# Patient Record
Sex: Female | Born: 1960 | Race: White | Hispanic: No | Marital: Married | Smoking: Former smoker
Health system: Southern US, Community
[De-identification: ages and names within clinical notes are randomized; demographics above are authoritative.]

## PROBLEM LIST (undated history)

## (undated) DIAGNOSIS — F419 Anxiety disorder, unspecified: Secondary | ICD-10-CM

## (undated) DIAGNOSIS — E039 Hypothyroidism, unspecified: Secondary | ICD-10-CM

## (undated) DIAGNOSIS — J45909 Unspecified asthma, uncomplicated: Secondary | ICD-10-CM

## (undated) DIAGNOSIS — I10 Essential (primary) hypertension: Secondary | ICD-10-CM

## (undated) HISTORY — DX: Essential (primary) hypertension: I10

## (undated) HISTORY — DX: Unspecified asthma, uncomplicated: J45.909

## (undated) HISTORY — DX: Anxiety disorder, unspecified: F41.9

## (undated) HISTORY — DX: Hypothyroidism, unspecified: E03.9

---

## 1997-11-04 ENCOUNTER — Other Ambulatory Visit: Admission: RE | Admit: 1997-11-04 | Discharge: 1997-11-04 | Payer: Self-pay | Admitting: Gynecology

## 1999-09-20 ENCOUNTER — Other Ambulatory Visit: Admission: RE | Admit: 1999-09-20 | Discharge: 1999-09-20 | Payer: Self-pay | Admitting: Gynecology

## 2000-03-30 ENCOUNTER — Other Ambulatory Visit: Admission: RE | Admit: 2000-03-30 | Discharge: 2000-03-30 | Payer: Self-pay | Admitting: Gynecology

## 2001-05-14 ENCOUNTER — Other Ambulatory Visit: Admission: RE | Admit: 2001-05-14 | Discharge: 2001-05-14 | Payer: Self-pay | Admitting: Gynecology

## 2002-07-10 ENCOUNTER — Other Ambulatory Visit: Admission: RE | Admit: 2002-07-10 | Discharge: 2002-07-10 | Payer: Self-pay | Admitting: Gynecology

## 2006-10-31 ENCOUNTER — Other Ambulatory Visit: Admission: RE | Admit: 2006-10-31 | Discharge: 2006-10-31 | Payer: Self-pay | Admitting: Gynecology

## 2009-08-04 ENCOUNTER — Ambulatory Visit: Payer: Self-pay | Admitting: Gynecology

## 2009-08-04 ENCOUNTER — Other Ambulatory Visit: Admission: RE | Admit: 2009-08-04 | Discharge: 2009-08-04 | Payer: Self-pay | Admitting: Gynecology

## 2012-11-29 DIAGNOSIS — E039 Hypothyroidism, unspecified: Secondary | ICD-10-CM

## 2012-11-29 HISTORY — DX: Hypothyroidism, unspecified: E03.9

## 2012-12-23 ENCOUNTER — Encounter: Payer: Self-pay | Admitting: Gynecology

## 2012-12-23 ENCOUNTER — Ambulatory Visit (INDEPENDENT_AMBULATORY_CARE_PROVIDER_SITE_OTHER): Payer: 59 | Admitting: Gynecology

## 2012-12-23 ENCOUNTER — Other Ambulatory Visit (HOSPITAL_COMMUNITY)
Admission: RE | Admit: 2012-12-23 | Discharge: 2012-12-23 | Disposition: A | Discharge status: Home / Self Care | Payer: 59 | Source: Ambulatory Visit | Attending: Gynecology | Admitting: Gynecology

## 2012-12-23 VITALS — BP 124/74 | Ht 66.0 in | Wt 175.0 lb

## 2012-12-23 DIAGNOSIS — Z30431 Encounter for routine checking of intrauterine contraceptive device: Secondary | ICD-10-CM

## 2012-12-23 DIAGNOSIS — Z13220 Encounter for screening for lipoid disorders: Secondary | ICD-10-CM

## 2012-12-23 DIAGNOSIS — N951 Menopausal and female climacteric states: Secondary | ICD-10-CM

## 2012-12-23 DIAGNOSIS — Z1151 Encounter for screening for human papillomavirus (HPV): Secondary | ICD-10-CM | POA: Insufficient documentation

## 2012-12-23 DIAGNOSIS — Z01419 Encounter for gynecological examination (general) (routine) without abnormal findings: Secondary | ICD-10-CM

## 2012-12-23 NOTE — Patient Instructions (Addendum)
Call to Schedule your mammogram  Facilities in [place]: 1)  [facility], [address] Phone: [phone] 2)  [facility]. [facility], [address] Phone: [phone] 3)  Dr. Bertrand at [facility]  [address] Phone: [phone]     Mammogram A mammogram is an X-ray test to find changes in a woman's breast. You should get a mammogram if:  You are 40 years of age or older  You have risk factors.   Your doctor recommends that you have one.  BEFORE THE TEST  Do not schedule the test the week before your period, especially if your breasts are sore during this time.  On the day of your mammogram:  Wash your breasts and armpits well. After washing, do not put on any deodorant or talcum powder on until after your test.   Eat and drink as you usually do.   Take your medicines as usual.   If you are diabetic and take insulin, make sure you:   Eat before coming for your test.   Take your insulin as usual.   If you cannot keep your appointment, call before the appointment to cancel. Schedule another appointment.  TEST  You will need to undress from the waist up. You will put on a hospital gown.   Your breast will be put on the mammogram machine, and it will press firmly on your breast with a piece of plastic called a compression paddle. This will make your breast flatter so that the machine can X-ray all parts of your breast.   Both breasts will be X-rayed. Each breast will be X-rayed from above and from the side. An X-ray might need to be taken again if the picture is not good enough.   The mammogram will last about 15 to 30 minutes.  AFTER THE TEST Finding out the results of your test Ask when your test results will be ready. Make sure you get your test results.  Document Released: 07/14/2008 Document Revised: 04/06/2011 Document Reviewed: 07/14/2008 ExitCare Patient  Information 2012 [employer].   

## 2012-12-23 NOTE — Progress Notes (Signed)
Stephanie Duncan 1960-09-10 [id number]        52 y.o.  [id number] for annual exam.  Has not been in for over 3 years. Several issues noted below.  Past medical history,surgical history, medications, allergies, family history and social history were all reviewed and documented in the EPIC chart.  ROS:  Performed and pertinent positives and negatives are included in the history, assessment and plan .  Exam: Kim assistant Filed Vitals:   12/23/12 1524  BP: 124/74  Height: 5\' 6"  (1.676 m)  Weight: 175 lb (79.379 kg)   General appearance  Normal Skin grossly normal Head/Neck normal with no cervical or supraclavicular adenopathy thyroid normal Lungs  clear Cardiac RR, without RMG Abdominal  soft, nontender, without masses, organomegaly or hernia Breasts  examined lying and sitting without masses, retractions, discharge or axillary adenopathy. Pelvic  Ext/BUS/vagina  normal  Cervix  normal IUD string visualized. Pap HPV  Uterus  anteverted, normal size, shape and contour, midline and mobile nontender   Adnexa  Without masses or tenderness    Anus and perineum  normal   Rectovaginal  normal sphincter tone without palpated masses or tenderness.    Assessment/Plan:  52 y.o. [id number] female for annual exam, amenorrheic, Mirena IUD.Marland Kitchen   1. Mirena IUD. In place for 6 years. Recommended removal now. Patient agrees. IUD string was visualized, grasped with the [facility] forcep and removed. It was shown to the patient and discarded. Patient having menopausal symptoms. Will check FSH. If elevated then plan barrier contraception for now and menstrual calendar. If normal then we'll consider replacement of her Mirena IUD. 2. Menopausal symptoms. Check TSH/FSH. Has tried over-the-counter products without success.  I reviewed the whole issue of HRT with her to include the WHI study with increased risk of stroke, heart attack, DVT and breast cancer. The ACOG and NAMS statements for lowest dose for the shortest period of  time reviewed. Transdermal versus oral first-pass effect benefit discussed.  Patient wants to monitor at present. She will call if symptoms worsen wants to consider HRT. 3. Pap smear/HPV done today. No history of abnormal Pap smears previously. 4. Mammography 2012. Patient knows she is overdue and agrees to schedule. SBE monthly reviewed. 5. Colonoscopy never. Recommended she schedule and she agrees to do so by the end of the year. 6. Health maintenance. Baseline CBC comprehensive metabolic panel lipid profile urinalysis FSH TSH and vitamin D ordered.  Note: This document was prepared with digital dictation and possible smart phrase technology. Any transcriptional errors that result from this process are unintentional.   Dara Lords MD, 3:48 PM 12/23/2012

## 2012-12-24 ENCOUNTER — Other Ambulatory Visit: Payer: Self-pay | Admitting: Gynecology

## 2012-12-24 DIAGNOSIS — R7989 Other specified abnormal findings of blood chemistry: Secondary | ICD-10-CM

## 2012-12-26 ENCOUNTER — Encounter: Payer: Self-pay | Admitting: Gynecology

## 2012-12-26 ENCOUNTER — Other Ambulatory Visit: Payer: 59

## 2012-12-26 DIAGNOSIS — R7989 Other specified abnormal findings of blood chemistry: Secondary | ICD-10-CM

## 2012-12-26 LAB — COMPREHENSIVE METABOLIC PANEL
AST: 19 U/L (ref 0–37)
Albumin: 4.3 g/dL (ref 3.5–5.2)
Alkaline Phosphatase: 101 U/L (ref 39–117)
Glucose, Bld: 87 mg/dL (ref 70–99)
Potassium: 5.3 mEq/L (ref 3.5–5.3)
Sodium: 140 mEq/L (ref 135–145)
Total Protein: 6.8 g/dL (ref 6.0–8.3)

## 2012-12-26 LAB — TSH: TSH: 7.445 u[IU]/mL — ABNORMAL HIGH (ref 0.350–4.500)

## 2012-12-31 ENCOUNTER — Telehealth: Payer: Self-pay | Admitting: *Deleted

## 2012-12-31 NOTE — Telephone Encounter (Signed)
Message copied by Aura Camps on Tue Dec 31, 2012 10:09 AM ------      Message from: Keenan Bachelor      Created: Thu Dec 26, 2012  3:57 PM      Regarding: Referral Endocrinologist       Patient informed. She knows it will be next week before she hears about appt date/time.            Tell patient TSH still elevated consistent with hypothyroidism or underactive thyroid. Recommend appointment with endocrinologist for evaluation and treatment initiation.  ------

## 2012-12-31 NOTE — Telephone Encounter (Signed)
Notes faxed to [facility] office they will contact with time and date.

## 2013-01-03 ENCOUNTER — Telehealth: Payer: Self-pay

## 2013-01-03 NOTE — Telephone Encounter (Signed)
Patient said she saw you last week and was informed her TSH was elevated at 7.455. We referred her to Dr. Talmage Nap.  They sent her paperwork and appt info and her appt is 02/07/13.  She called them and this is the soonest they can see her.  She said she does not think appropriate she should wait until October to be seen.  She wants you to refer her to someone else.

## 2013-01-03 NOTE — Telephone Encounter (Signed)
Victorino Dike, I noticed on the lab result that Dr. Velvet Bathe just said "endocrinology referral".  He did not specify Dr. Talmage Nap. Wondering if you could get her in with anyone else sooner? (I do not know any other endoc MD names.) Thanks for handling this!

## 2013-01-06 NOTE — Telephone Encounter (Signed)
appt with Dr. Romero Belling on 01/15/13 @ 1:30 pm left message for pt to call regarding this.

## 2013-01-06 NOTE — Telephone Encounter (Signed)
They sent her paperwork and appt info and her appt is 02/07/13. She called them and this is the soonest they can see her.  appt scheduled with Dr.Ellison on 01/15/13 @ 1:30 pm

## 2013-01-06 NOTE — Telephone Encounter (Signed)
Pt informed with the below note. 

## 2013-01-15 ENCOUNTER — Ambulatory Visit (INDEPENDENT_AMBULATORY_CARE_PROVIDER_SITE_OTHER): Payer: 59 | Admitting: Endocrinology

## 2013-01-15 ENCOUNTER — Encounter: Payer: Self-pay | Admitting: Endocrinology

## 2013-01-15 VITALS — BP 130/80 | HR 80 | Ht 67.0 in | Wt 173.0 lb

## 2013-01-15 DIAGNOSIS — IMO0001 Reserved for inherently not codable concepts without codable children: Secondary | ICD-10-CM

## 2013-01-15 DIAGNOSIS — Z309 Encounter for contraceptive management, unspecified: Secondary | ICD-10-CM

## 2013-01-15 DIAGNOSIS — F329 Major depressive disorder, single episode, unspecified: Secondary | ICD-10-CM

## 2013-01-15 DIAGNOSIS — J45909 Unspecified asthma, uncomplicated: Secondary | ICD-10-CM

## 2013-01-15 DIAGNOSIS — E039 Hypothyroidism, unspecified: Secondary | ICD-10-CM

## 2013-01-15 MED ORDER — LEVOTHYROXINE SODIUM 50 MCG PO TABS: 50.0000 ug | ORAL_TABLET | Freq: Every day | ORAL | Status: DC

## 2013-01-15 NOTE — Progress Notes (Signed)
Subjective:    Patient ID: Stephanie Duncan, female    DOB: 02-Dec-1960, 52 y.o.   MRN: [id number]  HPI Pt was noted approx 3 weeks ago to have abnormal TSH.  She has slight hair loss on the head, and assoc fatigue.   She was in ICU ([facility]) dec 2013, for severe asthma.  She has not recently been on steroids, except for MDI's.   Past Medical History  Diagnosis Date  . Asthma   . Hypothyroid 11/2012    No past surgical history on file.  History   Social History  . Marital Status: Married    Spouse Name: N/A    Number of Children: N/A  . Years of Education: N/A   Occupational History  . Not on file.   Social History Main Topics  . Smoking status: Former Games developer  . Smokeless tobacco: Not on file  . Alcohol Use: Yes     Comment: Rare  . Drug Use: No  . Sexual Activity: Yes     Comment: Mirena inserted 11-26-06   Other Topics Concern  . Not on file   Social History Narrative  . No narrative on file    Current Outpatient Prescriptions on File Prior to Visit  Medication Sig Dispense Refill  . citalopram (CELEXA) 20 MG tablet Take 20 mg by mouth daily.      . Fluticasone Propionate, Inhal, (FLOVENT DISKUS) 250 MCG/BLIST AEPB Inhale into the lungs.      Marland Kitchen levonorgestrel (MIRENA) 20 MCG/24HR IUD 1 each by Intrauterine route once.      . montelukast (SINGULAIR) 10 MG tablet Take 10 mg by mouth at bedtime.      . triamcinolone (NASACORT) 55 MCG/ACT nasal inhaler Place 2 sprays into the nose daily.       No current facility-administered medications on file prior to visit.   No Known Allergies  Family History  Problem Relation Age of Onset  . Hypertension Mother   . Stroke Mother   no thyroid probs in immediate family. BP 130/80  Pulse 80  Ht 5\' 7"  (1.702 m)  Wt 173 lb (78.472 kg)  BMI 27.09 kg/m2  SpO2 95%  Review of Systems denies depression, cramps, sob, fever, memory loss, constipation, numbness, blurry vision, myalgias, rhinorrhea, and syncope. She has weight gain,  easy bruising, and dry skin.      Objective:   Physical Exam VS: see vs page GEN: no distress HEAD: head: no deformity eyes: no periorbital swelling, no proptosis external nose and ears are normal mouth: no lesion seen NECK: thyroid is slightly and diffusely enlarged.   CHEST WALL: no deformity LUNGS:  Clear to auscultation CV: reg rate and rhythm, no murmur ABD: abdomen is soft, nontender.  no hepatosplenomegaly.  not distended.  no hernia MUSCULOSKELETAL: muscle bulk and strength are grossly normal.  no obvious joint swelling.  gait is normal and steady EXTEMITIES: no deformity.  no ulcer on the feet.  feet are of normal color and temp.  no edema PULSES: dorsalis pedis intact bilat.  no carotid bruit NEURO:  cn 2-12 grossly intact.   readily moves all 4's.  sensation is intact to touch on the feet SKIN:  Normal texture and temperature.  No rash or suspicious lesion is visible.   NODES:  None palpable at the neck PSYCH: alert, oriented x3.  Does not appear anxious nor depressed.  Lab Results  Component Value Date   TSH 7.445* 12/26/2012   T4TOTAL 5.5 12/26/2012  Assessment & Plan:  Hypothyroidism, new, mild Hair loss. Unlikely to be thyroid-related Asthma: she has not recently been on enough steroid to account for the other sxs noted above, such as fatigue.

## 2013-01-15 NOTE — Patient Instructions (Addendum)
i have sent a prescription to your pharmacy, for the thyroid. Please see dr Derrell Lolling in 4-6 weeks.  You will be due for a repeat blood test then. I would be happy to see you back here whenever you want.     Hypothyroidism The thyroid is a large gland located in the lower front of your neck. The thyroid gland helps control metabolism. Metabolism is how your body handles food. It controls metabolism with the hormone thyroxine. When this gland is underactive (hypothyroid), it produces too little hormone.  CAUSES These include:   Absence or destruction of thyroid tissue.  Goiter due to iodine deficiency.  Goiter due to medications.  Congenital defects (since birth).  Problems with the pituitary. This causes a lack of TSH (thyroid stimulating hormone). This hormone tells the thyroid to turn out more hormone. SYMPTOMS  Lethargy (feeling as though you have no energy)  Cold intolerance  Weight gain (in spite of normal food intake)  Dry skin  Coarse hair  Menstrual irregularity (if severe, may lead to infertility)  Slowing of thought processes Cardiac problems are also caused by insufficient amounts of thyroid hormone. Hypothyroidism in the newborn is cretinism, and is an extreme form. It is important that this form be treated adequately and immediately or it will lead rapidly to retarded physical and mental development. DIAGNOSIS  To prove hypothyroidism, your caregiver may do blood tests and ultrasound tests. Sometimes the signs are hidden. It may be necessary for your caregiver to watch this illness with blood tests either before or after diagnosis and treatment. TREATMENT  Low levels of thyroid hormone are increased by using synthetic thyroid hormone. This is a safe, effective treatment. It usually takes about four weeks to gain the full effects of the medication. After you have the full effect of the medication, it will generally take another four weeks for problems to leave. Your  caregiver may start you on low doses. If you have had heart problems the dose may be gradually increased. It is generally not an emergency to get rapidly to normal. HOME CARE INSTRUCTIONS   Take your medications as your caregiver suggests. Let your caregiver know of any medications you are taking or start taking. Your caregiver will help you with dosage schedules.  As your condition improves, your dosage needs may increase. It will be necessary to have continuing blood tests as suggested by your caregiver.  Report all suspected medication side effects to your caregiver. SEEK MEDICAL CARE IF: Seek medical care if you develop:  Sweating.  Tremulousness (tremors).  Anxiety.  Rapid weight loss.  Heat intolerance.  Emotional swings.  Diarrhea.  Weakness. SEEK IMMEDIATE MEDICAL CARE IF:  You develop chest pain, an irregular heart beat (palpitations), or a rapid heart beat. MAKE SURE YOU:   Understand these instructions.  Will watch your condition.  Will get help right away if you are not doing well or get worse. Document Released: 04/17/2005 Document Revised: 07/10/2011 Document Reviewed: 12/06/2007 [facility] Patient Information 2014 [place], [place].

## 2013-01-16 DIAGNOSIS — F329 Major depressive disorder, single episode, unspecified: Secondary | ICD-10-CM | POA: Insufficient documentation

## 2013-01-16 DIAGNOSIS — J45909 Unspecified asthma, uncomplicated: Secondary | ICD-10-CM | POA: Insufficient documentation

## 2013-01-16 DIAGNOSIS — E039 Hypothyroidism, unspecified: Secondary | ICD-10-CM | POA: Insufficient documentation

## 2013-01-16 DIAGNOSIS — F3289 Other specified depressive episodes: Secondary | ICD-10-CM | POA: Insufficient documentation

## 2013-01-23 ENCOUNTER — Ambulatory Visit: Payer: 59 | Admitting: Endocrinology

## 2013-01-27 ENCOUNTER — Ambulatory Visit: Payer: 59 | Admitting: Endocrinology

## 2013-02-18 ENCOUNTER — Telehealth: Payer: Self-pay | Admitting: Endocrinology

## 2013-02-18 NOTE — Telephone Encounter (Signed)
please call patient: Ultrasound shows an inflamed thyroid.  This is expected when it is underactive. Please continue the same medication. I would be happy to see you back here whenever you want.

## 2013-02-19 NOTE — Telephone Encounter (Signed)
Left message, pt to call if any questions.

## 2013-03-06 ENCOUNTER — Other Ambulatory Visit: Payer: Self-pay

## 2013-04-18 ENCOUNTER — Other Ambulatory Visit (HOSPITAL_COMMUNITY): Payer: Self-pay | Admitting: Orthopedic Surgery

## 2013-04-18 DIAGNOSIS — M25879 Other specified joint disorders, unspecified ankle and foot: Secondary | ICD-10-CM

## 2013-07-13 ENCOUNTER — Other Ambulatory Visit: Payer: Self-pay | Admitting: Endocrinology

## 2013-08-11 ENCOUNTER — Other Ambulatory Visit: Payer: Self-pay | Admitting: Endocrinology

## 2013-11-20 ENCOUNTER — Other Ambulatory Visit: Payer: Self-pay | Admitting: Gynecology

## 2013-11-20 DIAGNOSIS — Z1231 Encounter for screening mammogram for malignant neoplasm of breast: Secondary | ICD-10-CM

## 2013-12-25 ENCOUNTER — Ambulatory Visit (INDEPENDENT_AMBULATORY_CARE_PROVIDER_SITE_OTHER): Payer: 59 | Admitting: Gynecology

## 2013-12-25 ENCOUNTER — Ambulatory Visit (HOSPITAL_COMMUNITY)
Admission: RE | Admit: 2013-12-25 | Discharge: 2013-12-25 | Disposition: A | Discharge status: Home / Self Care | Payer: 59 | Source: Ambulatory Visit | Attending: Gynecology | Admitting: Gynecology

## 2013-12-25 ENCOUNTER — Encounter: Payer: Self-pay | Admitting: Gynecology

## 2013-12-25 VITALS — BP 122/78 | Ht 66.0 in | Wt 184.0 lb

## 2013-12-25 DIAGNOSIS — E038 Other specified hypothyroidism: Secondary | ICD-10-CM

## 2013-12-25 DIAGNOSIS — Z1231 Encounter for screening mammogram for malignant neoplasm of breast: Secondary | ICD-10-CM

## 2013-12-25 DIAGNOSIS — Z01419 Encounter for gynecological examination (general) (routine) without abnormal findings: Secondary | ICD-10-CM

## 2013-12-25 IMAGING — MG MM DIGITAL SCREENING BILAT
4 series · 4 of 4 positions shown · non-contrast
Comparison: Previous exam(s).

CLINICAL DATA: Screening.

EXAM:
DIGITAL SCREENING BILATERAL MAMMOGRAM WITH CAD

[R CC]
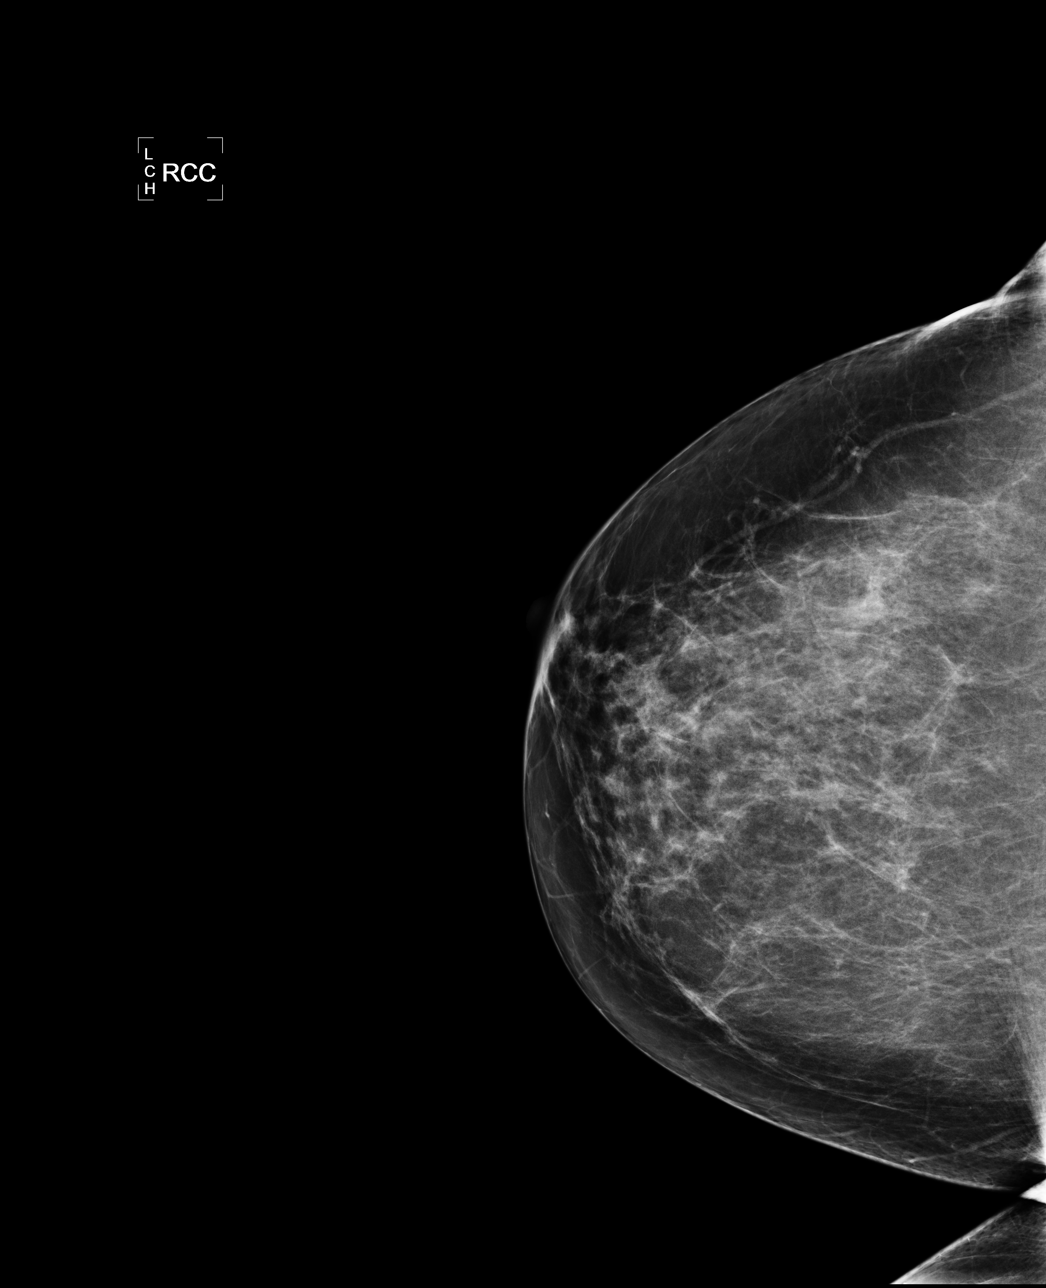

[R MLO]
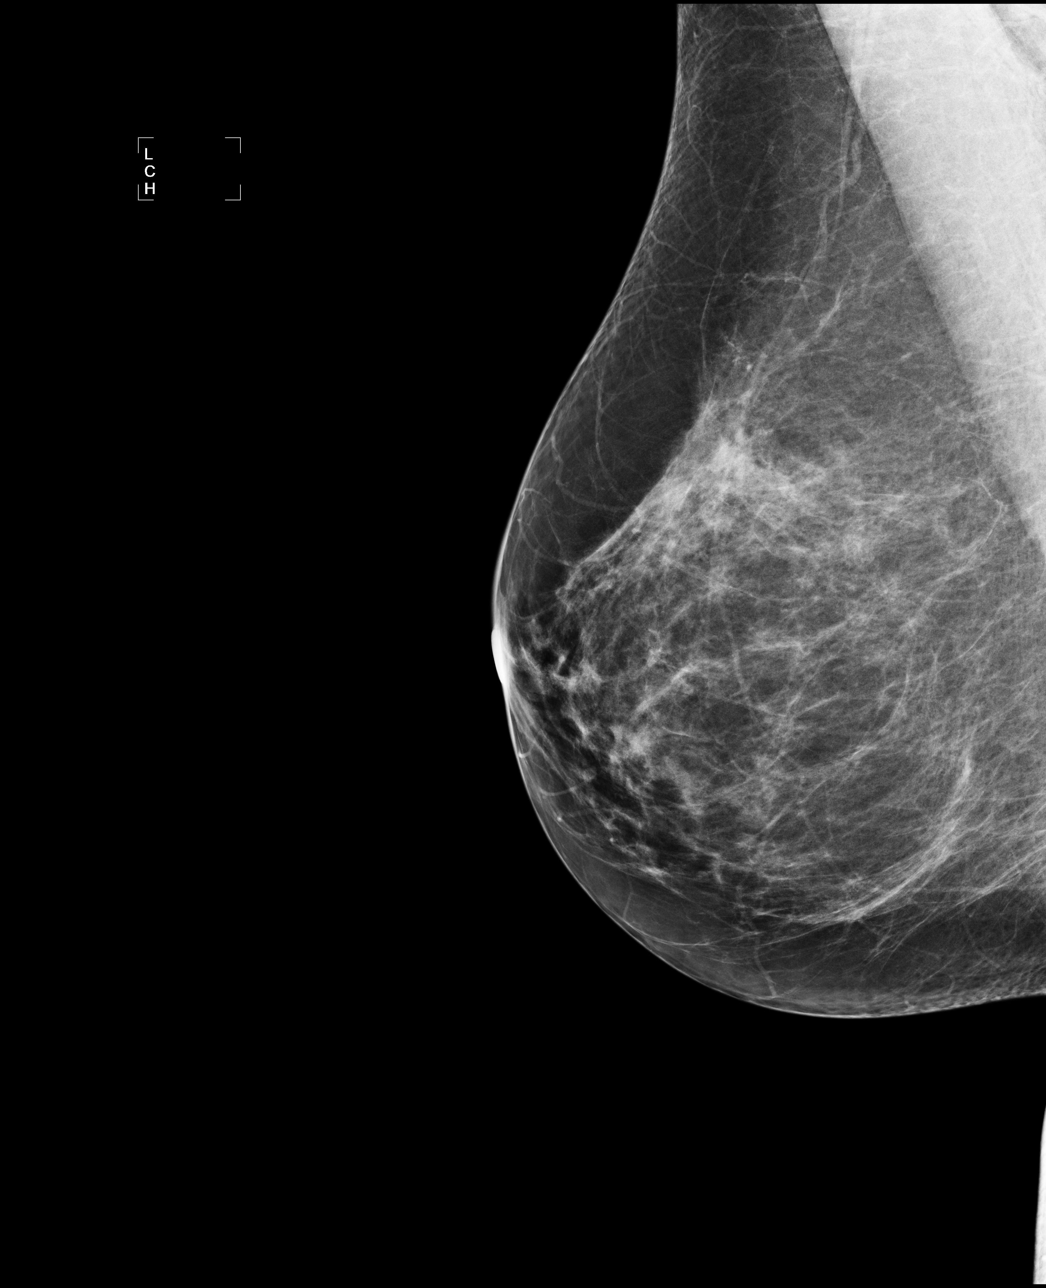

[L CC]
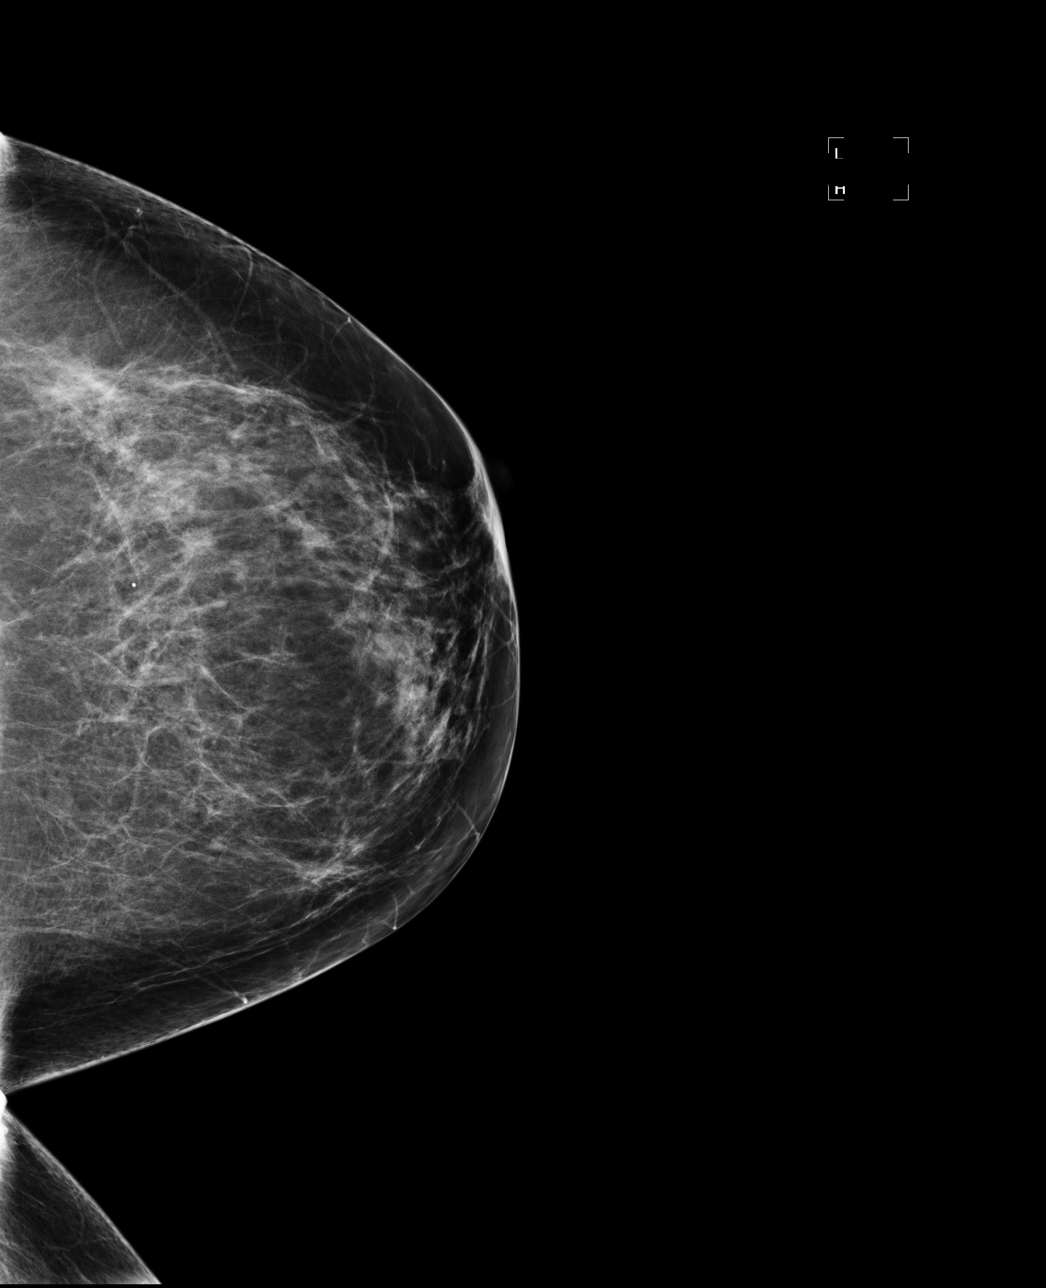

[L MLO]
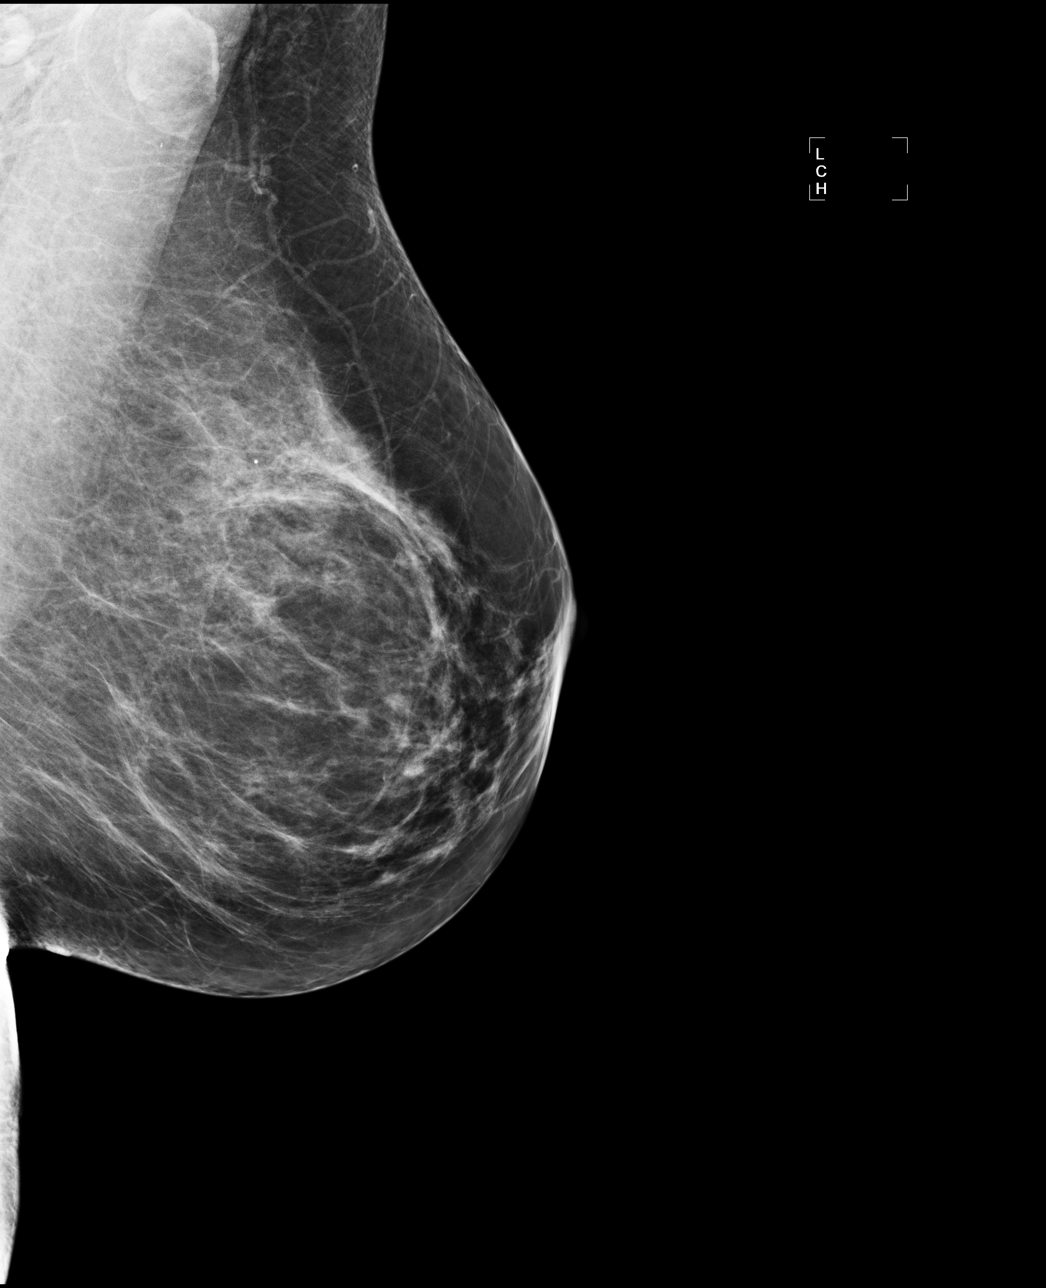

[4 of 4 positions shown; findings below may reference images not displayed]

ACR Breast Density Category b: There are scattered areas of
fibroglandular density.
FINDINGS: There are no findings suspicious for malignancy. Images were
processed with CAD.
IMPRESSION: No mammographic evidence of malignancy. A result letter of this
screening mammogram will be mailed directly to the patient.

RECOMMENDATION:
Screening mammogram in one year. (Code:[US])

BI-RADS CATEGORY  1: Negative.

## 2013-12-25 NOTE — Patient Instructions (Signed)
Schedule colonoscopy with [facility] at [phone] or [facility]enterology at [phone]  You may obtain a copy of any labs that were done today by logging onto MyChart as outlined in the instructions provided with your AVS (after visit summary). The office will not call with normal lab results but certainly if there are any significant abnormalities then we will contact you.   Health Maintenance, Female A healthy lifestyle and preventative care can promote health and wellness.  Maintain regular health, dental, and eye exams.  Eat a healthy diet. Foods like vegetables, fruits, whole grains, low-fat dairy products, and lean protein foods contain the nutrients you need without too many calories. Decrease your intake of foods high in solid fats, added sugars, and salt. Get information about a proper diet from your caregiver, if necessary.  Regular physical exercise is one of the most important things you can do for your health. Most adults should get at least 150 minutes of moderate-intensity exercise (any activity that increases your heart rate and causes you to sweat) each week. In addition, most adults need muscle-strengthening exercises on 2 or more days a week.   Maintain a healthy weight. The body mass index (BMI) is a screening tool to identify possible weight problems. It provides an estimate of body fat based on height and weight. Your caregiver can help determine your BMI, and can help you achieve or maintain a healthy weight. For adults 20 years and older:  A BMI below 18.5 is considered underweight.  A BMI of 18.5 to 24.9 is normal.  A BMI of 25 to 29.9 is considered overweight.  A BMI of 30 and above is considered obese.  Maintain normal blood lipids and cholesterol by exercising and minimizing your intake of saturated fat. Eat a balanced diet with plenty of fruits and vegetables. Blood tests for lipids and cholesterol should begin at age 64 and be repeated every  5 years. If your lipid or cholesterol levels are high, you are over 50, or you are a high risk for heart disease, you may need your cholesterol levels checked more frequently.Ongoing high lipid and cholesterol levels should be treated with medicines if diet and exercise are not effective.  If you smoke, find out from your caregiver how to quit. If you do not use tobacco, do not start.  Lung cancer screening is recommended for adults aged 58 80 years who are at high risk for developing lung cancer because of a history of smoking. Yearly low-dose computed tomography (CT) is recommended for people who have at least a 30-pack-year history of smoking and are a current smoker or have quit within the past 15 years. A pack year of smoking is smoking an average of 1 pack of cigarettes a day for 1 year (for example: 1 pack a day for 30 years or 2 packs a day for 15 years). Yearly screening should continue until the smoker has stopped smoking for at least 15 years. Yearly screening should also be stopped for people who develop a health problem that would prevent them from having lung cancer treatment.  If you are pregnant, do not drink alcohol. If you are breastfeeding, be very cautious about drinking alcohol. If you are not pregnant and choose to drink alcohol, do not exceed 1 drink per day. One drink is considered to be 12 ounces (355 mL) of beer, 5 ounces (148 mL) of wine, or 1.5 ounces (44 mL) of liquor.  Avoid use of street drugs. Do not share needles  with anyone. Ask for help if you need support or instructions about stopping the use of drugs.  High blood pressure causes heart disease and increases the risk of stroke. Blood pressure should be checked at least every 1 to 2 years. Ongoing high blood pressure should be treated with medicines, if weight loss and exercise are not effective.  If you are 55 to 52 years old, ask your caregiver if you should take aspirin to prevent strokes.  Diabetes screening  involves taking a blood sample to check your fasting blood sugar level. This should be done once every 3 years, after age 45, if you are within normal weight and without risk factors for diabetes. Testing should be considered at a younger age or be carried out more frequently if you are overweight and have at least 1 risk factor for diabetes.  Breast cancer screening is essential preventative care for women. You should practice "breast self-awareness." This means understanding the normal appearance and feel of your breasts and may include breast self-examination. Any changes detected, no matter how small, should be reported to a caregiver. Women in their 20s and 30s should have a clinical breast exam (CBE) by a caregiver as part of a regular health exam every 1 to 3 years. After age 40, women should have a CBE every year. Starting at age 40, women should consider having a mammogram (breast X-ray) every year. Women who have a family history of breast cancer should talk to their caregiver about genetic screening. Women at a high risk of breast cancer should talk to their caregiver about having an MRI and a mammogram every year.  Breast cancer gene (BRCA)-related cancer risk assessment is recommended for women who have family members with BRCA-related cancers. BRCA-related cancers include breast, ovarian, tubal, and peritoneal cancers. Having family members with these cancers may be associated with an increased risk for harmful changes (mutations) in the breast cancer genes BRCA1 and BRCA2. Results of the assessment will determine the need for genetic counseling and BRCA1 and BRCA2 testing.  The Pap test is a screening test for cervical cancer. Women should have a Pap test starting at age 21. Between ages 21 and 29, Pap tests should be repeated every 2 years. Beginning at age 30, you should have a Pap test every 3 years as long as the past 3 Pap tests have been normal. If you had a hysterectomy for a problem that  was not cancer or a condition that could lead to cancer, then you no longer need Pap tests. If you are between ages 65 and 70, and you have had normal Pap tests going back 10 years, you no longer need Pap tests. If you have had past treatment for cervical cancer or a condition that could lead to cancer, you need Pap tests and screening for cancer for at least 20 years after your treatment. If Pap tests have been discontinued, risk factors (such as a new sexual partner) need to be reassessed to determine if screening should be resumed. Some women have medical problems that increase the chance of getting cervical cancer. In these cases, your caregiver may recommend more frequent screening and Pap tests.  The human papillomavirus (HPV) test is an additional test that may be used for cervical cancer screening. The HPV test looks for the virus that can cause the cell changes on the cervix. The cells collected during the Pap test can be tested for HPV. The HPV test could be used to screen women aged   30 years and older, and should be used in women of any age who have unclear Pap test results. After the age of 30, women should have HPV testing at the same frequency as a Pap test.  Colorectal cancer can be detected and often prevented. Most routine colorectal cancer screening begins at the age of 50 and continues through age 75. However, your caregiver may recommend screening at an earlier age if you have risk factors for colon cancer. On a yearly basis, your caregiver may provide home test kits to check for hidden blood in the stool. Use of a small camera at the end of a tube, to directly examine the colon (sigmoidoscopy or colonoscopy), can detect the earliest forms of colorectal cancer. Talk to your caregiver about this at age 50, when routine screening begins. Direct examination of the colon should be repeated every 5 to 10 years through age 75, unless early forms of pre-cancerous polyps or small growths are  found.  Hepatitis C blood testing is recommended for all people born from 1945 through 1965 and any individual with known risks for hepatitis C.  Practice safe sex. Use condoms and avoid high-risk sexual practices to reduce the spread of sexually transmitted infections (STIs). Sexually active women aged 25 and younger should be checked for Chlamydia, which is a common sexually transmitted infection. Older women with new or multiple partners should also be tested for Chlamydia. Testing for other STIs is recommended if you are sexually active and at increased risk.  Osteoporosis is a disease in which the bones lose minerals and strength with aging. This can result in serious bone fractures. The risk of osteoporosis can be identified using a bone density scan. Women ages 65 and over and women at risk for fractures or osteoporosis should discuss screening with their caregivers. Ask your caregiver whether you should be taking a calcium supplement or vitamin D to reduce the rate of osteoporosis.  Menopause can be associated with physical symptoms and risks. Hormone replacement therapy is available to decrease symptoms and risks. You should talk to your caregiver about whether hormone replacement therapy is right for you.  Use sunscreen. Apply sunscreen liberally and repeatedly throughout the day. You should seek shade when your shadow is shorter than you. Protect yourself by wearing long sleeves, pants, a wide-brimmed hat, and sunglasses year round, whenever you are outdoors.  Notify your caregiver of new moles or changes in moles, especially if there is a change in shape or color. Also notify your caregiver if a mole is larger than the size of a pencil eraser.  Stay current with your immunizations. Document Released: 10/31/2010 Document Revised: 08/12/2012 Document Reviewed: 10/31/2010 [employer] Patient Information 2014 [employer].   

## 2013-12-25 NOTE — Addendum Note (Signed)
Addended by: Richardson Chiquito on: 12/25/2013 10:46 AM   Modules accepted: Orders

## 2013-12-25 NOTE — Progress Notes (Signed)
Stephanie Duncan 1960-05-21 [id number]        53 y.o.  [id number] for annual exam.  Several issues noted below.  Past medical history,surgical history, problem list, medications, allergies, family history and social history were all reviewed and documented as reviewed in the EPIC chart.  ROS:  12 system ROS performed with pertinent positives and negatives included in the history, assessment and plan.   Additional significant findings :  None   Exam: Kim assistant Filed Vitals:   12/25/13 0832  BP: 122/78  Height:  (1.676 m)  Weight: 184 lb (83.462 kg)   General appearance:  Normal affect, orientation and appearance. Skin: Grossly normal HEENT: Without gross lesions.  No cervical or supraclavicular adenopathy. Thyroid normal.  Lungs:  Clear without wheezing, rales or rhonchi Cardiac: RR, without RMG Abdominal:  Soft, nontender, without masses, guarding, rebound, organomegaly or hernia Breasts:  Examined lying and sitting without masses, retractions, discharge or axillary adenopathy. Pelvic:  Ext/BUS/vagina normal  Cervix normal  Uterus anteverted, normal size, shape and contour, midline and mobile nontender   Adnexa  Without masses or tenderness    Anus and perineum  Normal   Rectovaginal  Normal sphincter tone without palpated masses or tenderness.    Assessment/Plan:  17 y.o. Stephanie Duncan female for annual exam.   1. Postmenopausal. Patient without bleeding since removing her IUD. Not having significant hot flashes, night sweats, vaginal dryness or dyspareunia. Will continue to monitor. Call if any vaginal bleeding. 2. Hypothyroid. Diagnosed last year on thyroid replacement. Check TSH. 3. Pap smear/HPV negative 2014. No Pap smear done today. Plan to repeat at 3-5 your interval per current screening guidelines. No history of abnormal Pap smears previously. 4. Mammography 2012. Patient has mammogram scheduled today. SBE monthly reviewed. 5. DEXA never. Will plan further into the  menopause. Increase calcium vitamin D reviewed. Check vitamin D level today. 6. Colonoscopy never. Recommended screening colonoscopy and she agrees to schedule the end of this year. 7. Health maintenance. Baseline CBC comprehensive metabolic panel lipid profile urinalysis TSH vitamin D ordered today. Followup in one year, sooner if any issues.   Note: This document was prepared with digital dictation and possible smart phrase technology. Any transcriptional errors that result from this process are unintentional.   Dara Lords MD, 8:56 AM 12/25/2013

## 2013-12-30 ENCOUNTER — Other Ambulatory Visit: Payer: Self-pay | Admitting: Gynecology

## 2013-12-30 DIAGNOSIS — R7989 Other specified abnormal findings of blood chemistry: Secondary | ICD-10-CM

## 2013-12-30 DIAGNOSIS — E7800 Pure hypercholesterolemia, unspecified: Secondary | ICD-10-CM

## 2013-12-31 ENCOUNTER — Other Ambulatory Visit: Payer: Self-pay | Admitting: Gynecology

## 2013-12-31 ENCOUNTER — Other Ambulatory Visit: Payer: 59

## 2013-12-31 DIAGNOSIS — E7800 Pure hypercholesterolemia, unspecified: Secondary | ICD-10-CM

## 2013-12-31 DIAGNOSIS — R7989 Other specified abnormal findings of blood chemistry: Secondary | ICD-10-CM

## 2013-12-31 DIAGNOSIS — R946 Abnormal results of thyroid function studies: Secondary | ICD-10-CM

## 2014-01-07 ENCOUNTER — Telehealth: Payer: Self-pay

## 2014-01-07 ENCOUNTER — Other Ambulatory Visit: Payer: Self-pay | Admitting: Gynecology

## 2014-01-07 DIAGNOSIS — E039 Hypothyroidism, unspecified: Secondary | ICD-10-CM

## 2014-01-07 DIAGNOSIS — E7800 Pure hypercholesterolemia, unspecified: Secondary | ICD-10-CM

## 2014-01-07 MED ORDER — LEVOTHYROXINE SODIUM 25 MCG PO TABS: 25.0000 ug | ORAL_TABLET | Freq: Every day | ORAL | Status: AC

## 2014-01-07 NOTE — Telephone Encounter (Signed)
I can handle the thyroid replacement. I would recommend going to the 25 mcg from the 50 mcg she is on now. #30 refill x3 with recommended repeat TSH in 2 months. As far as the cholesterol I would recommend increasing activity and a more healthy diet as far as decreased fat and repeating a fasting lipid profile in 6 months. Hopefully her numbers will improve without medication. But if medication is needed then she would have to see a primary physician as I do not do the cholesterol medication prescribing

## 2014-01-07 NOTE — Telephone Encounter (Signed)
At patient's request detailed message left on her voice mail and she will call me if any questions. rx sent. Lab orders placed and recall put in system.

## 2014-01-07 NOTE — Telephone Encounter (Signed)
I had called her yesterday and at her request left detailed message regarding her TSH being low and FLP abnormal and that you recommended she see Dr. Everardo All for follow up.  Patient calls today stating she only say Dr. Everardo All one time to have thyroid evaluated and to be started on Levothyroxine.  She said he told her that she would be fine to let her regular doctor manage her medication after that visit.  Patient said it costs her a lot to go see him because he is a specialist. She asked if you could adjust her thyroid medication for her.  Also, she asked if you think she needs medication for cholesterol results?  She said she really does not have another doctor other than you and wondered if you would recommend regarding that result as well and would you treat her if she needed medication.

## 2014-03-02 ENCOUNTER — Encounter: Payer: Self-pay | Admitting: Gynecology

## 2014-04-01 ENCOUNTER — Ambulatory Visit: Payer: 59 | Admitting: Endocrinology

## 2014-05-07 ENCOUNTER — Other Ambulatory Visit: Payer: Self-pay | Admitting: Gynecology

## 2015-02-10 ENCOUNTER — Other Ambulatory Visit: Payer: Self-pay

## 2015-02-10 DIAGNOSIS — Z1231 Encounter for screening mammogram for malignant neoplasm of breast: Secondary | ICD-10-CM

## 2015-02-17 ENCOUNTER — Ambulatory Visit: Payer: Self-pay

## 2015-03-15 ENCOUNTER — Ambulatory Visit: Payer: Self-pay

## 2018-06-08 DIAGNOSIS — J069 Acute upper respiratory infection, unspecified: Secondary | ICD-10-CM | POA: Diagnosis not present

## 2018-06-08 DIAGNOSIS — R05 Cough: Secondary | ICD-10-CM | POA: Diagnosis not present

## 2018-09-16 DIAGNOSIS — J45901 Unspecified asthma with (acute) exacerbation: Secondary | ICD-10-CM | POA: Diagnosis not present

## 2018-09-29 DIAGNOSIS — R05 Cough: Secondary | ICD-10-CM | POA: Diagnosis not present

## 2018-09-29 DIAGNOSIS — J45909 Unspecified asthma, uncomplicated: Secondary | ICD-10-CM | POA: Diagnosis not present

## 2018-09-29 DIAGNOSIS — R0602 Shortness of breath: Secondary | ICD-10-CM | POA: Diagnosis not present

## 2018-09-29 DIAGNOSIS — J209 Acute bronchitis, unspecified: Secondary | ICD-10-CM | POA: Diagnosis not present

## 2018-10-03 DIAGNOSIS — R05 Cough: Secondary | ICD-10-CM | POA: Diagnosis not present

## 2018-10-03 DIAGNOSIS — R062 Wheezing: Secondary | ICD-10-CM | POA: Diagnosis not present

## 2018-10-03 DIAGNOSIS — R0602 Shortness of breath: Secondary | ICD-10-CM | POA: Diagnosis not present

## 2018-10-03 DIAGNOSIS — Z20828 Contact with and (suspected) exposure to other viral communicable diseases: Secondary | ICD-10-CM | POA: Diagnosis not present

## 2018-10-03 DIAGNOSIS — R0682 Tachypnea, not elsewhere classified: Secondary | ICD-10-CM | POA: Diagnosis not present

## 2018-10-24 DIAGNOSIS — E785 Hyperlipidemia, unspecified: Secondary | ICD-10-CM | POA: Diagnosis not present

## 2018-10-24 DIAGNOSIS — F411 Generalized anxiety disorder: Secondary | ICD-10-CM | POA: Diagnosis not present

## 2018-10-24 DIAGNOSIS — E039 Hypothyroidism, unspecified: Secondary | ICD-10-CM | POA: Diagnosis not present

## 2018-10-24 DIAGNOSIS — I700 Atherosclerosis of aorta: Secondary | ICD-10-CM | POA: Diagnosis not present

## 2018-11-10 DIAGNOSIS — R062 Wheezing: Secondary | ICD-10-CM | POA: Diagnosis not present

## 2018-11-10 DIAGNOSIS — J4541 Moderate persistent asthma with (acute) exacerbation: Secondary | ICD-10-CM | POA: Diagnosis not present

## 2018-11-10 DIAGNOSIS — Z87891 Personal history of nicotine dependence: Secondary | ICD-10-CM | POA: Diagnosis not present

## 2018-12-04 DIAGNOSIS — J4540 Moderate persistent asthma, uncomplicated: Secondary | ICD-10-CM | POA: Diagnosis not present

## 2018-12-04 DIAGNOSIS — R05 Cough: Secondary | ICD-10-CM | POA: Diagnosis not present

## 2019-02-18 DIAGNOSIS — R911 Solitary pulmonary nodule: Secondary | ICD-10-CM | POA: Diagnosis not present

## 2019-02-18 DIAGNOSIS — J984 Other disorders of lung: Secondary | ICD-10-CM | POA: Diagnosis not present

## 2019-03-03 DIAGNOSIS — Z0184 Encounter for antibody response examination: Secondary | ICD-10-CM | POA: Diagnosis not present

## 2019-12-02 DIAGNOSIS — R0603 Acute respiratory distress: Secondary | ICD-10-CM | POA: Diagnosis not present

## 2019-12-02 DIAGNOSIS — Z0389 Encounter for observation for other suspected diseases and conditions ruled out: Secondary | ICD-10-CM | POA: Diagnosis not present

## 2019-12-02 DIAGNOSIS — U071 COVID-19: Secondary | ICD-10-CM | POA: Diagnosis not present

## 2019-12-02 DIAGNOSIS — Z79899 Other long term (current) drug therapy: Secondary | ICD-10-CM | POA: Diagnosis not present

## 2019-12-02 DIAGNOSIS — J4521 Mild intermittent asthma with (acute) exacerbation: Secondary | ICD-10-CM | POA: Diagnosis not present

## 2019-12-02 DIAGNOSIS — J9801 Acute bronchospasm: Secondary | ICD-10-CM | POA: Diagnosis not present

## 2019-12-02 DIAGNOSIS — R0602 Shortness of breath: Secondary | ICD-10-CM | POA: Diagnosis not present

## 2019-12-18 ENCOUNTER — Telehealth: Payer: Self-pay | Admitting: Pulmonary Disease

## 2019-12-18 NOTE — Telephone Encounter (Signed)
Spoke with pt . She has new consult appt with Dr Wynona Neat 01/19/20. Pt wanting to be seen sooner. PT had Covid in May and is still c/o wheezing, cough ( clear after abx 3 weeks ago)sob, cant go up stairs, nasal congestion. Finished steroid on 12/09/19. Pt takes Symbicort bid, using Albuterol inh up to 10 times a day and Albuterol Neb up to 3 times a day. No sooner openings on schedule. I see that Post Covid clinic opened today. Is that an option? Please advise.  ( Pt has not been vaccinated)

## 2019-12-18 NOTE — Telephone Encounter (Signed)
ATC patient unable to reach LM to call back office (x1)  

## 2019-12-18 NOTE — Telephone Encounter (Signed)
12/18/2019  Patient could be scheduled for consult with Dr. Judeth Horn or Dr. Francine Graven.  If they have earlier availabilities.  If not patient can present to the post Covid clinic for further evaluation given her ongoing symptoms.  Please provide the contact information for the patient.  In route message to Angus Seller, NP is likely that this will be the provider who would be seen the patient.  Would recommend the patient keep consult with our office given ongoing symptoms.  Elisha Headland, FNP

## 2019-12-18 NOTE — Telephone Encounter (Signed)
Pt returning a phone call. Pt can be reached at [phone].

## 2019-12-18 NOTE — Telephone Encounter (Signed)
Spoke with pt and advised to Elisha Headland NP recommendations. I gave pt the phone number to scheduling for post covid care center. PT will keep appt with Dr Wynona Neat. I also message Angus Seller, NP regarding pt. Nothing further needed at this time.

## 2020-01-06 ENCOUNTER — Ambulatory Visit: Payer: Self-pay

## 2020-01-19 ENCOUNTER — Encounter: Payer: Self-pay | Admitting: Pulmonary Disease

## 2020-01-19 ENCOUNTER — Ambulatory Visit (INDEPENDENT_AMBULATORY_CARE_PROVIDER_SITE_OTHER): Payer: No Typology Code available for payment source | Admitting: Pulmonary Disease

## 2020-01-19 ENCOUNTER — Other Ambulatory Visit: Payer: Self-pay

## 2020-01-19 VITALS — BP 118/68 | HR 88 | Temp 97.4°F | Ht 67.0 in | Wt 170.2 lb

## 2020-01-19 DIAGNOSIS — J4541 Moderate persistent asthma with (acute) exacerbation: Secondary | ICD-10-CM

## 2020-01-19 MED ORDER — BUDESONIDE 0.5 MG/2ML IN SUSP: 0.5000 mg | Freq: Two times a day (BID) | RESPIRATORY_TRACT | 3 refills | Status: DC

## 2020-01-19 MED ORDER — PREDNISONE 20 MG PO TABS: 10.0000 mg | ORAL_TABLET | Freq: Every day | ORAL | 1 refills | Status: DC

## 2020-01-19 NOTE — Progress Notes (Signed)
Stephanie Duncan    [id number]    [birth year]  Primary Care Physician:Hagler, Fleet Contras, MD  Referring Physician: Aliene Beams, MD [id number] [place],  [place] [address]  Chief complaint:   Patient being seen for shortness of breath  HPI:  History of asthma Elevated eosinophil levels in the past  Was recently hospitalized for asthma exacerbation  Steroids did not seem to help as much this time around  She is short of breath with all activity  Recent exacerbation was started following exposure to somebody with strong perfume at work  Diagnosed with asthma many years back Has seen multiple physicians at [facility]  History of hypothyroidism History of depression with anxiety  Remote smoking history 25 years ago  Outpatient Encounter Medications as of 01/19/2020  Medication Sig  . atorvastatin (LIPITOR) 40 MG tablet Take by mouth.  . budesonide-formoterol (SYMBICORT) 160-4.5 MCG/ACT inhaler Inhale 2 puffs into the lungs 2 (two) times daily.  . citalopram (CELEXA) 20 MG tablet Take 20 mg by mouth daily.  . Fluticasone Propionate, Inhal, (FLOVENT DISKUS) 250 MCG/BLIST AEPB Inhale into the lungs.  Marland Kitchen levothyroxine (SYNTHROID, LEVOTHROID) 25 MCG tablet Take 1 tablet (25 mcg total) by mouth daily before breakfast.  . montelukast (SINGULAIR) 10 MG tablet Take 10 mg by mouth at bedtime.  Marland Kitchen omeprazole (PRILOSEC) 20 MG capsule Take 20 mg by mouth daily.  Marland Kitchen tiotropium (SPIRIVA) 18 MCG inhalation capsule Place 18 mcg into inhaler and inhale daily.  Marland Kitchen triamcinolone (NASACORT) 55 MCG/ACT nasal inhaler Place 2 sprays into the nose daily.  . [DISCONTINUED] aspirin EC 81 MG tablet Take 81 mg by mouth daily.   No facility-administered encounter medications on file as of 01/19/2020.    Allergies as of 01/19/2020 - Review Complete 01/19/2020  Allergen Reaction Noted  . Other Swelling 12/25/2013  . Prednisone  12/25/2013    Past Medical History:  Diagnosis Date  . Asthma   .  Hypothyroid 11/2012    No past surgical history on file.  Family History  Problem Relation Age of Onset  . Hypertension Mother   . Stroke Mother     Social History   Socioeconomic History  . Marital status: Married    Spouse name: Not on file  . Number of children: Not on file  . Years of education: Not on file  . Highest education level: Not on file  Occupational History  . Not on file  Tobacco Use  . Smoking status: Former Games developer  . Smokeless tobacco: Never Used  Substance and Sexual Activity  . Alcohol use: Yes    Comment: Rare  . Drug use: No  . Sexual activity: Yes  Other Topics Concern  . Not on file  Social History Narrative  . Not on file   Social Determinants of Health   Financial Resource Strain:   . Difficulty of Paying Living Expenses: Not on file  Food Insecurity:   . Worried About Programme researcher, broadcasting/film/video in the Last Year: Not on file  . Ran Out of Food in the Last Year: Not on file  Transportation Needs:   . Lack of Transportation (Medical): Not on file  . Lack of Transportation (Non-Medical): Not on file  Physical Activity:   . Days of Exercise per Week: Not on file  . Minutes of Exercise per Session: Not on file  Stress:   . Feeling of Stress : Not on file  Social Connections:   .  Frequency of Communication with Friends and Family: Not on file  . Frequency of Social Gatherings with Friends and Family: Not on file  . Attends Religious Services: Not on file  . Active Member of Clubs or Organizations: Not on file  . Attends Banker Meetings: Not on file  . Marital Status: Not on file  Intimate Partner Violence:   . Fear of Current or Ex-Partner: Not on file  . Emotionally Abused: Not on file  . Physically Abused: Not on file  . Sexually Abused: Not on file    Review of Systems  Constitutional: Positive for fatigue.  Respiratory: Positive for shortness of breath.     Vitals:   01/19/20 1625  BP: 118/68  Pulse: 88  Temp: (!)  97.4 F (36.3 C)  SpO2: 99%     Physical Exam Constitutional:      Appearance: Normal appearance.  HENT:     Nose: No congestion.     Mouth/Throat:     Mouth: Mucous membranes are moist.  Cardiovascular:     Rate and Rhythm: Normal rate and regular rhythm.     Heart sounds: No murmur heard.  No friction rub.  Pulmonary:     Effort: Pulmonary effort is normal. No respiratory distress.     Breath sounds: No stridor. No wheezing or rhonchi.  Musculoskeletal:     Cervical back: No rigidity or tenderness.  Neurological:     Mental Status: She is alert.  Psychiatric:        Mood and Affect: Mood normal.    Data Reviewed: Records from care everywhere reviewed Was recently seen 03/19/2019-severe asthma with acute exacerbation, on Pulmicort, allergy panel Was seen by Dr. Su Monks  Allergy panel 03/19/2019   Assessment:  Severe asthma with exacerbation  Elevated eosinophil counts in the past, was as high as 45 in 2019  Bronchospasm  Severe shortness of breath with activity  Plan/Recommendations: Obtain pulmonary function test  Obtain CBC with differentials, IgE  Pulmicort nebulization 0.5 twice daily  Prednisone to be used only as needed if Pulmicort does not seem to be helping symptoms  Avoid known triggers as tolerated  Follow-up in 4 to 6 weeks  Virl Diamond MD [facility] 01/19/2020, 4:53 PM  CC: Stephanie Beams, MD

## 2020-01-19 NOTE — Patient Instructions (Signed)
Asthma with exacerbation  Eosinophilic asthma  Recent hospitalization  Prescription for Pulmicort 0.5 nebulized twice daily  Prednisone 20 mg daily for 7 to 10 days-only use this if you are not feeling any better using the Pulmicort   Obtain pulmonary function tests  Blood work-CBC with differentials, IgE  I will see you back in the office in 4 to 6 weeks

## 2020-02-05 ENCOUNTER — Ambulatory Visit: Payer: No Typology Code available for payment source | Admitting: Pulmonary Disease

## 2020-03-04 ENCOUNTER — Encounter: Payer: Self-pay | Admitting: Allergy

## 2020-03-04 ENCOUNTER — Other Ambulatory Visit: Payer: Self-pay

## 2020-03-04 ENCOUNTER — Ambulatory Visit (INDEPENDENT_AMBULATORY_CARE_PROVIDER_SITE_OTHER): Payer: BC Managed Care – PPO | Admitting: Allergy

## 2020-03-04 VITALS — BP 132/88 | HR 76 | Temp 97.8°F | Resp 14 | Ht 65.25 in | Wt 174.8 lb

## 2020-03-04 DIAGNOSIS — J3089 Other allergic rhinitis: Secondary | ICD-10-CM | POA: Diagnosis not present

## 2020-03-04 DIAGNOSIS — R12 Heartburn: Secondary | ICD-10-CM | POA: Diagnosis not present

## 2020-03-04 DIAGNOSIS — J4550 Severe persistent asthma, uncomplicated: Secondary | ICD-10-CM

## 2020-03-04 MED ORDER — BENRALIZUMAB 30 MG/ML ~~LOC~~ SOSY
30.0000 mg | PREFILLED_SYRINGE | Freq: Once | SUBCUTANEOUS | Status: AC
  Administered 2020-03-04: 30 mg via SUBCUTANEOUS

## 2020-03-04 MED ORDER — EPINEPHRINE 0.3 MG/0.3ML IJ SOAJ: 0.3000 mg | Freq: Once | INTRAMUSCULAR | 1 refills | Status: AC

## 2020-03-04 NOTE — Assessment & Plan Note (Signed)
   Continue with Prilosec 20mg  daily as prescribed.  Gave handout on proper heartburn lifestyle measures.

## 2020-03-04 NOTE — Assessment & Plan Note (Addendum)
Diagnosed with asthma over 10 years ago however having with worsening symptoms the last 3 years.  Currently on Symbicort 160 mcg 2 puffs twice a day, Spiriva 1 capsule daily and using albuterol multiple times a day.  Also needing prednisone at least twice a month.  Patient had COVID-19 in May 2021 and in August 2021.  Patient had multiple hospitalizations and was intubated in the past   Follows with pulmonology.  Eosinophils in August 2021 was 600.  Today's spirometry showed some obstruction and patient took her albuterol right before the visit.  No improvement in FEV1 post bronchodilator treatment and clinically feeling the same. . Start Fasenra injections. Sample given today.  Reviewed risk and benefits. Patient prefers to do at home injections. o We will start prior authorization process. o Consent was signed. o I have prescribed epinephrine injectable and demonstrated proper use. For mild symptoms you can take over the counter antihistamines such as Benadryl and monitor symptoms closely. If symptoms worsen or if you have severe symptoms including breathing issues, throat closure, significant swelling, whole body hives, severe diarrhea and vomiting, lightheadedness then inject epinephrine and seek immediate medical care afterwards. . Daily controller medication(s): continue Symbicort 2 puffs twice a day with spacer and rinse mouth afterwards. . Continue Spiriva daily. . Continue Singulair (montelukast) 10mg  daily at night. . May use albuterol rescue inhaler 2 puffs every 4 to 6 hours as needed for shortness of breath, chest tightness, coughing, and wheezing. May use albuterol rescue inhaler 2 puffs 5 to 15 minutes prior to strenuous physical activities. Monitor frequency of use.  KEEP track of prednisone use.  Marland Kitchen BRING in all your inhalers and medications for the nebulizer machine to your next visit.  . Get repeat spirometry at next visit.

## 2020-03-04 NOTE — Patient Instructions (Addendum)
Asthma:  . Start Fasenra injections. Sample given today. o Will send Tammy our biologics coordinator about prior authorization.  o Consent was signed. o I have prescribed epinephrine injectable and demonstrated proper use. For mild symptoms you can take over the counter antihistamines such as Benadryl and monitor symptoms closely. If symptoms worsen or if you have severe symptoms including breathing issues, throat closure, significant swelling, whole body hives, severe diarrhea and vomiting, lightheadedness then inject epinephrine and seek immediate medical care afterwards. . Daily controller medication(s): continue Symbicort 2 puffs twice a day with spacer and rinse mouth afterwards. . Continue Spiriva daily. . Continue Singulair (montelukast) 10mg  daily at night. . May use albuterol rescue inhaler 2 puffs every 4 to 6 hours as needed for shortness of breath, chest tightness, coughing, and wheezing. May use albuterol rescue inhaler 2 puffs 5 to 15 minutes prior to strenuous physical activities. Monitor frequency of use.  KEEP track of prednisone use.  Marland Kitchen BRING in all your inhalers and medications for the nebulizer machine to your next visit.  . Asthma control goals:  o Full participation in all desired activities (may need albuterol before activity) o Albuterol use two times or less a week on average (not counting use with activity) o Cough interfering with sleep two times or less a month o Oral steroids no more than once a year o No hospitalizations  Rhinitis: Get bloodwork:  We are ordering labs, so please allow 1-2 weeks for the results to come back. With the newly implemented Cures Act, the labs might be visible to you at the same time that they become visible to me. However, I will not address the results until all of the results are back, so please be patient.   May use over the counter antihistamines such as Zyrtec (cetirizine), Claritin (loratadine), Allegra  (fexofenadine), or Xyzal (levocetirizine) daily as needed.  Use saline gel to keep nose moisturized.  Reflux:  Continue with Prilosec 20mg  daily as prescribed.  Follow up in 4 weeks for office visit and next Fasenra injections.   Heartburn Heartburn is a type of pain or discomfort that can happen in the throat or chest. It is often described as a burning pain. It may also cause a bad, acid-like taste in the mouth. Heartburn may feel worse when you lie down or bend over. It may be worse at night. It may be caused by stomach contents that move back up (reflux) into the tube that connects the mouth with the stomach (esophagus). Follow these instructions at home: Eating and drinking   Avoid certain foods and drinks as told by your doctor. This may include: ? Coffee and tea (with or without caffeine). ? Drinks that have alcohol. ? Energy drinks and sports drinks. ? Carbonated drinks or sodas. ? Chocolate and cocoa. ? Peppermint and mint flavorings. ? Garlic and onions. ? Horseradish. ? Spicy and acidic foods, such as:  Peppers.  Chili powder and curry powder.  Vinegar.  Hot sauces and BBQ sauce. ? Citrus fruit juices and citrus fruits, such as:  Oranges.  Lemons.  Limes. ? Tomato-based foods, such as:  Red sauce and pizza with red sauce.  Chili.  Salsa. ? Fried and fatty foods, such as:  Donuts.  Marland Kitchen fries and potato chips.  High-fat dressings. ? High-fat meats, such as:  Hot dogs and sausage.  Rib eye steak.  Ham and bacon. ? High-fat dairy items, such as:  Whole milk.  Butter.  Cream cheese.  Eat small meals often. Avoid eating large meals.  Avoid drinking large amounts of liquid with your meals.  Avoid eating meals during the 2-3 hours before bedtime.  Avoid lying down right after you eat.  Do not exercise right after you eat. Lifestyle      If you are overweight, lose an amount of weight that is healthy for you. Ask your doctor  about a safe weight loss goal.  Do not use any products that contain nicotine or tobacco, including cigarettes, e-cigarettes, and chewing tobacco. These can make your symptoms worse. If you need help quitting, ask your doctor.  Wear loose clothes. Do not wear anything tight around your waist.  Raise (elevate) the head of your bed about 6 inches (15 cm) when you sleep.  Try to lower your stress. If you need help doing this, ask your doctor. General instructions  Pay attention to any changes in your symptoms.  Take over-the-counter and prescription medicines only as told by your doctor. ? Do not take aspirin, ibuprofen, or other NSAIDs unless your doctor says it is okay. ? Stop medicines only as told by your doctor.  Keep all follow-up visits as told by your doctor. This is important. Contact a doctor if:  You have new symptoms.  You lose weight and you do not know why it is happening.  You have trouble swallowing, or it hurts to swallow.  You have wheezing or a cough that keeps happening.  Your symptoms do not get better with treatment.  You have heartburn often for more than 2 weeks. Get help right away if:  You have pain in your arms, neck, jaw, teeth, or back.  You feel sweaty, dizzy, or light-headed.  You have chest pain or shortness of breath.  You throw up (vomit) and your throw up looks like blood or coffee grounds.  Your poop (stool) is bloody or black. These symptoms may represent a serious problem that is an emergency. Do not wait to see if the symptoms will go away. Get medical help right away. Call your local emergency services (911 in the [place] Do not drive yourself to the hospital. Summary  Heartburn is a type of pain that can happen in the throat or chest. It can feel like a burning pain. It may also cause a bad, acid-like taste in the mouth.  You may need to avoid certain foods and drinks to help your symptoms. Ask your doctor what foods and drinks you  should avoid.  Take over-the-counter and prescription medicines only as told by your doctor. Do not take aspirin, ibuprofen, or other NSAIDs unless your doctor told you to do so.  Contact your doctor if your symptoms do not get better or they get worse. This information is not intended to replace advice given to you by your health care provider. Make sure you discuss any questions you have with your health care provider. Document Revised: 09/17/2017 Document Reviewed: 09/17/2017 Elsevier Patient Education  2020 [employer].

## 2020-03-04 NOTE — Progress Notes (Signed)
New Patient Note  RE: Stephanie Duncan MRN: [id number] DOB: Jan 16, 1961 Date of Office Visit: 03/04/2020  Referring provider: Aliene Beams, MD Primary care provider: Aliene Beams, MD  Chief Complaint: Asthma (Nov. 2020 diagnosed been hospitalized 3 times. )  History of Present Illness: I had the pleasure of seeing Stephanie Duncan for initial evaluation at the [facility] on 03/04/2020. She is a 59 y.o. female, who is referred here by Aliene Beams, MD for the evaluation of asthma.  She reports symptoms of chest tightness, shortness of breath, coughing with post tussive emesis, wheezing, nocturnal awakenings for 10+ years but worsening the past 3 years. Current medications include Symbicort 2 puffs BID, Spiriva 1 capsule daily, albuterol nebulizer prn which help. She reports using aerochamber with inhalers. She tried the following inhalers: unknown. Main triggers are strong scents.   Patient has been needing to use oral prednisone 20mg  about 1 week at a time twice a month.   In the last month, frequency of symptoms: daily. Frequency of nocturnal symptoms: nightly. Frequency of SABA use: once a day for nebulizer and few times albuterol use as well. Interference with physical activity: yes. Sleep is disturbed. In the last 12 months, emergency room visits/urgent care visits/doctor office visits or hospitalizations due to respiratory issues: 4-5 times in the ER, 7 times to outpatient PCP or Urgent care. In the last 12 months, oral steroids courses: 10+ with good benefit. Lifetime history of hospitalization for respiratory issues: 3 times since November 2020. Prior intubations: yes intubated twice. History of pneumonia: once 10 years ago. She was evaluated by allergist/pulmonologist in the past. Smoking exposure: smoked rarely as a teenager. Up to date with flu vaccine: no. Up to date with pneumonia vaccine: no.  History of reflux: yes and takes Prilosec daily.  Patient had Covid-19  in May 2021 and in August 2021.  01/19/2020 pulmonology visit: "Obtain pulmonary function test  Obtain CBC with differentials, IgE  Pulmicort nebulization 0.5 twice daily  Prednisone to be used only as needed if Pulmicort does not seem to be helping symptoms  Avoid known triggers as tolerated" " 12/22/2019 hospital admission: "Assessment and Plan  60 y.o. female admitted 12/22/2019 with PMHx of asthma, DVT, HLD, and thyroid diseasewho [facility] of[facility]ness [facility] breath, onset x1 month. Patient reports being prescribed prednisone threes time since July of this year for her asthma which she denies offering her any relief. Per EMS, patient self administered x3Atroventnebulizer treatments over the span of x2 hours prior to their arrival.EMS administered 1 albuterol, 2 duo nebulizer's, 125 mg of Solumedrol via IV en route to ED.Patient's husband informed EMS that patient has been intubated once a year for the past several yearssecondary to anxiety. He notes patient to be intubated around this time of year. Patient tested positive for COVID-19 in May of this year. Of note, per chart review patient tested positive for COVID-19 on 12/02/19. Patient is not vaccinated against COVID-19.   -Acute respiratory distress with mild hypercapnia secondary to acute asthma exacerbation. Patient has history of frequent asthma exacerbations sometimes requiring intubations. Was placed on BiPAP initially on admission. Currently saturating well on 2 L nasal cannula oxygen. Continue steroid therapy with Decadron continue bronchodilator inhaler therapy. Patient was noted to have had COVID-19 pneumonia but tested negative on 12/06/2019 per Patient and then tested positive again this admission on 12/22/2019 Will wean patient off oxygen and discharged on bronchodilator therapy patient will need Breo Ellipta inhaler  Symbicort as well as rescue therapy with  albuterol/ipratropium.  -Generalized anxiety disorder. We will continue to monitor. Patient will need antianxiety medication at discharge.  -Odynophagia discussed with GI. We will order barium swallow prior to discharge. Follow-up with GI in the outpatient setting."  Assessment and Plan: Stephanie Duncan is a 59 y.o. female with: Not well controlled severe persistent asthma Diagnosed with asthma over 10 years ago however having with worsening symptoms the last 3 years.  Currently on Symbicort 160 mcg 2 puffs twice a day, Spiriva 1 capsule daily and using albuterol multiple times a day.  Also needing prednisone at least twice a month.  Patient had COVID-19 in May 2021 and in August 2021.  Patient had multiple hospitalizations and was intubated in the past   Follows with pulmonology.  Eosinophils in August 2021 was 600.  Today's spirometry showed some obstruction and patient took her albuterol right before the visit.  No improvement in FEV1 post bronchodilator treatment and clinically feeling the same. . Start Fasenra injections. Sample given today.  Reviewed risk and benefits. Patient prefers to do at home injections. o We will start prior authorization process. o Consent was signed. o I have prescribed epinephrine injectable and demonstrated proper use. For mild symptoms you can take over the counter antihistamines such as Benadryl and monitor symptoms closely. If symptoms worsen or if you have severe symptoms including breathing issues, throat closure, significant swelling, whole body hives, severe diarrhea and vomiting, lightheadedness then inject epinephrine and seek immediate medical care afterwards. . Daily controller medication(s): continue Symbicort 2 puffs twice a day with spacer and rinse mouth afterwards. . Continue Spiriva daily. . Continue Singulair (montelukast) 10mg  daily at night. . May use albuterol rescue inhaler 2 puffs every 4 to 6 hours as needed for shortness of breath,  chest tightness, coughing, and wheezing. May use albuterol rescue inhaler 2 puffs 5 to 15 minutes prior to strenuous physical activities. Monitor frequency of use.  KEEP track of prednisone use.  Marland Kitchen BRING in all your inhalers and medications for the nebulizer machine to your next visit.  . Get repeat spirometry at next visit.  Other allergic rhinitis Perennial rhinoconjunctivitis symptoms for 20 years with worsening in the fall.  Tried Allegra, Flonase and Singulair with some benefit.  Skin testing over 5 years ago showed multiple positives and was on allergy immunotherapy only for 6 months at that time. Get bloodwork for environmental allergy panel instead of skin testing due to her poor asthma status. Will make additional recommendations based on results.  May use over the counter antihistamines such as Zyrtec (cetirizine), Claritin (loratadine), Allegra (fexofenadine), or Xyzal (levocetirizine) daily as needed.  Use saline gel to keep nose moisturized.  Continue Singulair 10 mg daily as above.  Heartburn  Continue with Prilosec 20mg  daily as prescribed.  Gave handout on proper heartburn lifestyle measures.  Return in about 4 weeks (around 04/01/2020).  Meds ordered this encounter  Medications  . EPINEPHrine (EPIPEN 2-PAK) 0.3 mg/0.3 mL IJ SOAJ injection    Sig: Inject 0.3 mg into the muscle once for 1 dose.    Dispense:  0.3 mL    Refill:  1  . Benralizumab SOSY 30 mg    Lab Orders     Allergens w/Total IgE Area 2  Other allergy screening: Rhino conjunctivitis: yes  She reports symptoms of itchy/watery eyes, nasal congestion, rhinorrhea, sneezing. Symptoms have been going on for 20 years. The symptoms are present all year around with  worsening in fall. Headache: yes. She has used Careers adviser, Flonase, Singulair with some improvement in symptoms. Sinus infections: 2 this year. Previous work up includes: skin testing over 5 years ago showed multiple positives and was on allergy  injections for 6 months with no benefit per patient report. Previous ENT evaluation: no. Previous sinus imaging: no. History of nasal polyps: no. Last eye exam: 2 years ago.  Food allergy: no Medication allergy: no Hymenoptera allergy: no Urticaria: no Eczema:no History of recurrent infections suggestive of immunodeficency: no  Diagnostics: Spirometry:  Tracings reviewed. Her effort: Good reproducible efforts. FVC: 2.78L FEV1: 1.70L, 63% predicted FEV1/FVC ratio: 61% Interpretation: Spirometry consistent with mild obstructive disease with no improvement in FEV1 post bronchodilator treatment.  Clinically feeling the same.  Patient took 2 puffs albuterol prior to the visit as well.  Please see scanned spirometry results for details.  Skin Testing: None.  Past Medical History: Patient Active Problem List   Diagnosis Date Noted  . Not well controlled severe persistent asthma 03/04/2020  . Other allergic rhinitis 03/04/2020  . Heartburn 03/04/2020  . Unspecified hypothyroidism 01/16/2013  . Depressive disorder, not elsewhere classified 01/16/2013  . Asthma, chronic 01/16/2013   Past Medical History:  Diagnosis Date  . Asthma   . Hypothyroid 11/2012   Past Surgical History: History reviewed. No pertinent surgical history. Medication List:  Current Outpatient Medications  Medication Sig Dispense Refill  . albuterol (PROVENTIL) (2.5 MG/3ML) 0.083% nebulizer solution Inhale into the lungs.    Marland Kitchen albuterol (VENTOLIN HFA) 108 (90 Base) MCG/ACT inhaler Inhale into the lungs.    . ALPRAZolam (XANAX) 0.25 MG tablet Take by mouth.    Marland Kitchen atorvastatin (LIPITOR) 40 MG tablet Take by mouth.    . benzonatate (TESSALON) 100 MG capsule Take by mouth 3 (three) times daily as needed for cough.    . budesonide-formoterol (SYMBICORT) 160-4.5 MCG/ACT inhaler Inhale 2 puffs into the lungs 2 (two) times daily.    . cetirizine (ZYRTEC) 10 MG tablet Take 10 mg by mouth daily.    . citalopram  (CELEXA) 20 MG tablet Take 20 mg by mouth daily.    Marland Kitchen EPINEPHrine 0.3 mg/0.3 mL IJ SOAJ injection Inject into the muscle.    . Fluticasone Propionate, Inhal, (FLOVENT DISKUS) 250 MCG/BLIST AEPB Inhale into the lungs.    Marland Kitchen levothyroxine (SYNTHROID, LEVOTHROID) 25 MCG tablet Take 1 tablet (25 mcg total) by mouth daily before breakfast. 30 tablet 3  . montelukast (SINGULAIR) 10 MG tablet Take 10 mg by mouth at bedtime.    Marland Kitchen omeprazole (PRILOSEC) 20 MG capsule Take 20 mg by mouth daily.    . predniSONE (DELTASONE) 20 MG tablet Take 0.5 tablets (10 mg total) by mouth daily with breakfast. 20 tablet 1  . tiotropium (SPIRIVA) 18 MCG inhalation capsule Place 18 mcg into inhaler and inhale daily.    . budesonide (PULMICORT) 0.5 MG/2ML nebulizer solution Take 2 mLs (0.5 mg total) by nebulization 2 (two) times daily. (Patient not taking: Reported on 03/04/2020) 100 mL 3  . EPINEPHrine (EPIPEN 2-PAK) 0.3 mg/0.3 mL IJ SOAJ injection Inject 0.3 mg into the muscle once for 1 dose. 0.3 mL 1  . triamcinolone (NASACORT) 55 MCG/ACT nasal inhaler Place 2 sprays into the nose daily. (Patient not taking: Reported on 03/04/2020)     No current facility-administered medications for this visit.   Allergies: Allergies  Allergen Reactions  . Other Swelling    FLU VACC.  Marland Kitchen Prednisone     Swelling,aching  Social History: Social History   Socioeconomic History  . Marital status: Married    Spouse name: Not on file  . Number of children: Not on file  . Years of education: Not on file  . Highest education level: Not on file  Occupational History  . Not on file  Tobacco Use  . Smoking status: Former Games developermoker  . Smokeless tobacco: Never Used  Substance and Sexual Activity  . Alcohol use: Yes    Comment: Rare  . Drug use: No  . Sexual activity: Yes  Other Topics Concern  . Not on file  Social History Narrative  . Not on file   Social Determinants of Health   Financial Resource Strain:   . Difficulty of  Paying Living Expenses: Not on file  Food Insecurity:   . Worried About Programme researcher, broadcasting/film/videounning Out of Food in the Last Year: Not on file  . Ran Out of Food in the Last Year: Not on file  Transportation Needs:   . Lack of Transportation (Medical): Not on file  . Lack of Transportation (Non-Medical): Not on file  Physical Activity:   . Days of Exercise per Week: Not on file  . Minutes of Exercise per Session: Not on file  Stress:   . Feeling of Stress : Not on file  Social Connections:   . Frequency of Communication with Friends and Family: Not on file  . Frequency of Social Gatherings with Friends and Family: Not on file  . Attends Religious Services: Not on file  . Active Member of Clubs or Organizations: Not on file  . Attends BankerClub or Organization Meetings: Not on file  . Marital Status: Not on file   Lives in a house which is 58 years old. Smoking: denies Occupation: unemployed  Environmental HistorySurveyor, minerals: Water Damage/mildew in the house: no Engineer, civil (consulting)Carpet in the family room: no Carpet in the bedroom: no Heating: heat pump Cooling: heat pump Pet: yes  1 dog x 7 yrs  Family History: Family History  Problem Relation Age of Onset  . Hypertension Mother   . Stroke Mother    Problem                               Relation Asthma                                   No  Eczema                                No  Food allergy                          No  Allergic rhino conjunctivitis     No   Review of Systems  Constitutional: Negative for appetite change, chills, fever and unexpected weight change.  HENT: Positive for congestion. Negative for rhinorrhea.   Eyes: Negative for itching.  Respiratory: Positive for cough, chest tightness and wheezing.   Cardiovascular: Negative for chest pain.  Gastrointestinal: Negative for abdominal pain.  Genitourinary: Negative for difficulty urinating.  Skin: Negative for rash.  Neurological: Positive for headaches.   Objective: BP 132/88   Pulse 76   Temp 97.8  F (36.6 C)   Resp 14   Ht 5' 5.25" (1.657 m)  Wt 174 lb 12.8 oz (79.3 kg)   SpO2 96%   BMI 28.87 kg/m  Body mass index is 28.87 kg/m. Physical Exam Vitals and nursing note reviewed.  Constitutional:      Appearance: Normal appearance. She is well-developed.  HENT:     Head: Normocephalic and atraumatic.     Right Ear: External ear normal.     Left Ear: External ear normal.     Nose: Nose normal.     Mouth/Throat:     Mouth: Mucous membranes are moist.     Pharynx: Oropharynx is clear.  Eyes:     Conjunctiva/sclera: Conjunctivae normal.  Cardiovascular:     Rate and Rhythm: Normal rate and regular rhythm.     Heart sounds: Normal heart sounds. No murmur heard.  No friction rub. No gallop.   Pulmonary:     Effort: Pulmonary effort is normal.     Breath sounds: Normal breath sounds. No wheezing, rhonchi or rales.  Abdominal:     Palpations: Abdomen is soft.  Musculoskeletal:     Cervical back: Neck supple.  Skin:    General: Skin is warm.     Findings: No rash.  Neurological:     Mental Status: She is alert and oriented to person, place, and time.  Psychiatric:        Behavior: Behavior normal.    The plan was reviewed with the patient/family, and all questions/concerned were addressed.  It was my pleasure to see Stephanie Duncan today and participate in her care. Please feel free to contact me with any questions or concerns.  Sincerely,  Wyline Mood, DO Allergy & Immunology  [facility] office: [phone] [facility] office: [phone]

## 2020-03-04 NOTE — Assessment & Plan Note (Addendum)
Perennial rhinoconjunctivitis symptoms for 20 years with worsening in the fall.  Tried Allegra, Flonase and Singulair with some benefit.  Skin testing over 5 years ago showed multiple positives and was on allergy immunotherapy only for 6 months at that time. Get bloodwork for environmental allergy panel instead of skin testing due to her poor asthma status. Will make additional recommendations based on results.  May use over the counter antihistamines such as Zyrtec (cetirizine), Claritin (loratadine), Allegra (fexofenadine), or Xyzal (levocetirizine) daily as needed.  Use saline gel to keep nose moisturized.  Continue Singulair 10 mg daily as above.

## 2020-03-04 NOTE — Progress Notes (Signed)
Immunotherapy   Patient Details  Name: Mariaceleste Herrera MRN: [id number] Date of Birth: [birth year]  03/04/2020  [name]  Started injections for Fasenra   Frequency: Every 4 Weeks x3, Then every 8 Weeks.  Epi-Pen: Yes Consent signed and patient instructions given. Patient started Harrington Challenger today and received her injection into her Right Arm. Patient waited 30 minutes in office and did not experience any issues.    Bushra Denman Fernandez-Vernon 03/04/2020, 11:35 AM

## 2020-03-06 LAB — ALLERGENS W/TOTAL IGE AREA 2

## 2020-03-09 ENCOUNTER — Telehealth: Payer: Self-pay | Admitting: *Deleted

## 2020-03-09 NOTE — Telephone Encounter (Signed)
-----   Message from Ellamae Sia, DO sent at 03/04/2020 12:47 PM EDT ----- Please start PA for Fasenra for asthma. Sample dose given today. 11/2019 bloodwork showed eos 600. Thank you.

## 2020-03-09 NOTE — Telephone Encounter (Signed)
Called patient and advised approval, copay card and submit to [facility]. Will need to answer call to get next injections shipped to office

## 2020-03-26 ENCOUNTER — Other Ambulatory Visit: Payer: Self-pay | Admitting: Pulmonary Disease

## 2020-03-31 ENCOUNTER — Ambulatory Visit: Payer: Self-pay

## 2020-03-31 ENCOUNTER — Telehealth: Payer: Self-pay | Admitting: *Deleted

## 2020-03-31 NOTE — Telephone Encounter (Signed)
Patient called to come in and get her second Fasenra injection, I advised patient that her injection is not here and asked if she had the injection with her. Patient stated that she did not have the medication. I called and spoke with Tammy in regards to the patient's medication and she stated that [facility] has been trying to get in touch with the patient to set up delivery. I called back the patient and stated that the pharmacy has been trying to get in touch with her and she became upset stating that she was not aware of this and was upset that she was not going to get an injection. I gave her the phone number [phone] for the pharmacy for her to call and set up shipment. I stated that as soon as she received a delivery date to please call us back and we will get her scheduled. Patient verbalized understanding.

## 2020-04-01 ENCOUNTER — Ambulatory Visit: Payer: Self-pay

## 2020-04-05 DIAGNOSIS — J4550 Severe persistent asthma, uncomplicated: Secondary | ICD-10-CM | POA: Diagnosis not present

## 2020-04-06 ENCOUNTER — Telehealth: Payer: Self-pay | Admitting: *Deleted

## 2020-04-06 NOTE — Telephone Encounter (Signed)
Patient called back and was scheduled to come in for her injection.

## 2020-04-06 NOTE — Telephone Encounter (Signed)
Received patient's Stephanie Duncan today. Called the patient and left a voicemail asking for her to return call to get her on the schedule for her injection in the [place] office.

## 2020-04-07 ENCOUNTER — Other Ambulatory Visit: Payer: Self-pay

## 2020-04-07 ENCOUNTER — Ambulatory Visit (INDEPENDENT_AMBULATORY_CARE_PROVIDER_SITE_OTHER): Payer: BC Managed Care – PPO | Admitting: *Deleted

## 2020-04-07 DIAGNOSIS — J4550 Severe persistent asthma, uncomplicated: Secondary | ICD-10-CM | POA: Diagnosis not present

## 2020-04-07 MED ORDER — BENRALIZUMAB 30 MG/ML ~~LOC~~ SOSY
30.0000 mg | PREFILLED_SYRINGE | Freq: Once | SUBCUTANEOUS | Status: AC
Start: 2020-04-07 — End: 2020-04-07
  Administered 2020-04-07: 30 mg via SUBCUTANEOUS

## 2020-05-05 ENCOUNTER — Ambulatory Visit: Payer: Self-pay

## 2020-05-14 ENCOUNTER — Ambulatory Visit: Payer: Self-pay

## 2020-05-18 ENCOUNTER — Ambulatory Visit (INDEPENDENT_AMBULATORY_CARE_PROVIDER_SITE_OTHER): Payer: 59

## 2020-05-18 ENCOUNTER — Other Ambulatory Visit: Payer: Self-pay

## 2020-05-18 DIAGNOSIS — E782 Mixed hyperlipidemia: Secondary | ICD-10-CM | POA: Diagnosis not present

## 2020-05-18 DIAGNOSIS — J4550 Severe persistent asthma, uncomplicated: Secondary | ICD-10-CM | POA: Diagnosis not present

## 2020-05-18 DIAGNOSIS — E039 Hypothyroidism, unspecified: Secondary | ICD-10-CM | POA: Diagnosis not present

## 2020-05-18 DIAGNOSIS — F419 Anxiety disorder, unspecified: Secondary | ICD-10-CM | POA: Diagnosis not present

## 2020-05-18 DIAGNOSIS — J4520 Mild intermittent asthma, uncomplicated: Secondary | ICD-10-CM | POA: Diagnosis not present

## 2020-05-18 MED ORDER — BENRALIZUMAB 30 MG/ML ~~LOC~~ SOSY
30.0000 mg | PREFILLED_SYRINGE | SUBCUTANEOUS | Status: AC
  Administered 2020-05-18 – 2023-12-27 (×24): 30 mg via SUBCUTANEOUS

## 2020-06-02 DIAGNOSIS — J4550 Severe persistent asthma, uncomplicated: Secondary | ICD-10-CM | POA: Diagnosis not present

## 2020-07-13 ENCOUNTER — Ambulatory Visit: Payer: Self-pay

## 2020-07-15 ENCOUNTER — Ambulatory Visit (INDEPENDENT_AMBULATORY_CARE_PROVIDER_SITE_OTHER): Payer: 59 | Admitting: *Deleted

## 2020-07-15 ENCOUNTER — Other Ambulatory Visit: Payer: Self-pay

## 2020-07-15 DIAGNOSIS — J4550 Severe persistent asthma, uncomplicated: Secondary | ICD-10-CM | POA: Diagnosis not present

## 2020-09-09 ENCOUNTER — Ambulatory Visit (INDEPENDENT_AMBULATORY_CARE_PROVIDER_SITE_OTHER): Payer: 59 | Admitting: *Deleted

## 2020-09-09 ENCOUNTER — Other Ambulatory Visit: Payer: Self-pay

## 2020-09-09 DIAGNOSIS — J4550 Severe persistent asthma, uncomplicated: Secondary | ICD-10-CM | POA: Diagnosis not present

## 2020-11-04 ENCOUNTER — Ambulatory Visit: Payer: Self-pay

## 2020-11-11 ENCOUNTER — Other Ambulatory Visit: Payer: Self-pay

## 2020-11-11 ENCOUNTER — Ambulatory Visit (INDEPENDENT_AMBULATORY_CARE_PROVIDER_SITE_OTHER): Payer: 59 | Admitting: *Deleted

## 2020-11-11 DIAGNOSIS — J4550 Severe persistent asthma, uncomplicated: Secondary | ICD-10-CM | POA: Diagnosis not present

## 2020-12-29 ENCOUNTER — Telehealth: Payer: Self-pay | Admitting: *Deleted

## 2020-12-29 NOTE — Telephone Encounter (Signed)
Patient advised she has been paying copays for her Harrington Challenger even though she suppose to have copay card on file.  I advised her I would contact my connection Candice and have her look into it.  Copay card was found with initial Rx sent and added to account and sent to resolution team. Called patient and advised wait 48 hours then reach out to [facility] to discuss getting her money refunded she has paid in for copays and let me know if she has any issues

## 2021-01-06 ENCOUNTER — Ambulatory Visit (INDEPENDENT_AMBULATORY_CARE_PROVIDER_SITE_OTHER): Payer: 59

## 2021-01-06 ENCOUNTER — Other Ambulatory Visit: Payer: Self-pay

## 2021-01-06 ENCOUNTER — Ambulatory Visit: Payer: 59

## 2021-01-06 DIAGNOSIS — J4550 Severe persistent asthma, uncomplicated: Secondary | ICD-10-CM

## 2021-03-03 ENCOUNTER — Ambulatory Visit (INDEPENDENT_AMBULATORY_CARE_PROVIDER_SITE_OTHER): Payer: 59 | Admitting: *Deleted

## 2021-03-03 ENCOUNTER — Other Ambulatory Visit: Payer: Self-pay

## 2021-03-03 DIAGNOSIS — J4550 Severe persistent asthma, uncomplicated: Secondary | ICD-10-CM | POA: Diagnosis not present

## 2021-04-16 ENCOUNTER — Other Ambulatory Visit: Payer: Self-pay | Admitting: Allergy

## 2021-04-18 NOTE — Telephone Encounter (Signed)
Please advise for Fasenra refill.

## 2021-04-19 ENCOUNTER — Telehealth: Payer: Self-pay | Admitting: *Deleted

## 2021-04-19 NOTE — Telephone Encounter (Signed)
Tried to contact patient to advise MD appt needed for reapproval but voicemail full. Note on appt done

## 2021-04-28 ENCOUNTER — Other Ambulatory Visit: Payer: Self-pay

## 2021-04-28 ENCOUNTER — Ambulatory Visit (INDEPENDENT_AMBULATORY_CARE_PROVIDER_SITE_OTHER): Payer: 59 | Admitting: *Deleted

## 2021-04-28 DIAGNOSIS — J4550 Severe persistent asthma, uncomplicated: Secondary | ICD-10-CM | POA: Diagnosis not present

## 2021-05-24 ENCOUNTER — Other Ambulatory Visit: Payer: Self-pay

## 2021-05-24 ENCOUNTER — Encounter: Payer: Self-pay | Admitting: Allergy & Immunology

## 2021-05-24 ENCOUNTER — Ambulatory Visit (INDEPENDENT_AMBULATORY_CARE_PROVIDER_SITE_OTHER): Payer: 59 | Admitting: Allergy & Immunology

## 2021-05-24 DIAGNOSIS — J4550 Severe persistent asthma, uncomplicated: Secondary | ICD-10-CM

## 2021-05-24 DIAGNOSIS — R12 Heartburn: Secondary | ICD-10-CM | POA: Diagnosis not present

## 2021-05-24 DIAGNOSIS — J3089 Other allergic rhinitis: Secondary | ICD-10-CM

## 2021-05-24 MED ORDER — MONTELUKAST SODIUM 10 MG PO TABS: 10.0000 mg | ORAL_TABLET | Freq: Every day | ORAL | 5 refills | Status: DC

## 2021-05-24 MED ORDER — BUDESONIDE-FORMOTEROL FUMARATE 160-4.5 MCG/ACT IN AERO: 2.0000 | INHALATION_SPRAY | Freq: Two times a day (BID) | RESPIRATORY_TRACT | 5 refills | Status: DC

## 2021-05-24 MED ORDER — ALBUTEROL SULFATE HFA 108 (90 BASE) MCG/ACT IN AERS: 1.0000 | INHALATION_SPRAY | RESPIRATORY_TRACT | 2 refills | Status: DC | PRN

## 2021-05-24 MED ORDER — OMEPRAZOLE 20 MG PO CPDR: 20.0000 mg | DELAYED_RELEASE_CAPSULE | Freq: Every day | ORAL | 5 refills | Status: DC

## 2021-05-24 MED ORDER — TIOTROPIUM BROMIDE MONOHYDRATE 18 MCG IN CAPS: 18.0000 ug | ORAL_CAPSULE | Freq: Every day | RESPIRATORY_TRACT | 5 refills | Status: DC

## 2021-05-24 NOTE — Progress Notes (Signed)
FOLLOW UP  Date of Service/Encounter:  05/24/21   Assessment:   Severe persistent asthma, uncomplicated - doing very well on the Fasenra without any systemic steroids since starting it  Perennial and seasonal allergic rhinitis - changing to [facility]  GERD  Plan/Recommendations:   1. Severe persistent asthma without complication - Lung testing looked decent. - We are not going to make any changes at all.  - Daily controller medication(s): Singulair 10mg  daily, Symbicort 160/4.12mcg two puffs twice daily with spacer, and Spiriva 4m daily  in combination with Fasenra every 2 months - Prior to physical activity: albuterol 2 puffs 10-15 minutes before physical activity. - Rescue medications: albuterol 4 puffs every 4-6 hours as needed - Asthma control goals:  * Full participation in all desired activities (may need albuterol before activity) * Albuterol use two time or less a week on average (not counting use with activity) * Cough interfering with sleep two time or less a month * Oral steroids no more than once a year * No hospitalizations  2. Allergic rhinitis - Stop the Flonase and start Xhance one spray per nostril twice daily. - This reaches deeper into the nose and should help with Eustachian tube drainage. - Continue with the Singulair 10mg  daily. - Continue with the over the counter antihistamine as needed.   3. Return in about 6 months (around 11/21/2021).    Subjective:   Stephanie Duncan is a 61 y.o. female presenting today for follow up of  Chief Complaint  Patient presents with   Asthma    Says her asthma is well. Mo issues. [name] is well for her.     [name] has a history of the following: Patient Active Problem List   Diagnosis Date Noted   Not well controlled severe persistent asthma 03/04/2020   Other allergic rhinitis 03/04/2020   Heartburn 03/04/2020   Unspecified hypothyroidism 01/16/2013   Depressive disorder, not elsewhere classified 01/16/2013    Asthma, chronic 01/16/2013    History obtained from: chart review and patient.  [name] is a 62 y.o. female presenting for a follow up visit.  She was last seen in November 2021.  At that time, she was started on Fasenra due to lack of asthma control.  She was also continued on Symbicort 160 mcg 2 puffs twice daily as well as Spiriva and Singulair.  For her environmental allergies, she was continued on an over-the-counter antihistamine as needed.  Since last visit, she has done well.  Asthma/Respiratory Symptom History: She has not needed any systemic steroids since starting Fasenra.  It has been quite a 46 for her.  She remains on the Symbicort 2 puffs twice daily.  She also uses the Spiriva 1 capsule daily.  This combination seems to be working very well.  She has not used her rescue inhaler in several months.  She has not been admitted to the hospital or in the emergency room since starting the Fasenra. She pay $75 a month for her Symbicort, but does not want to change it because it is been working fairly well.  Allergic Rhinitis Symptom History: Her allergic rhinitis symptoms are under good control with the over-the-counter antihistamine as well as the Singulair.  She uses the Flonase on a regular, but for the past month or so, she has been having a lot of right ear fullness.  She has gone to urgent care, where she was treated with amoxicillin.  She has never seen otolaryngology.  She has been on  fluticasone for quite some time for her rhinitis.  She is now working from home.  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  Working from home is helped decrease a lot of these exacerbations.  Otherwise, there have been no changes to her past medical history, surgical history, family history, or social history.    Review of Systems  Constitutional:  Negative.  Negative for chills, fever, malaise/fatigue and weight loss.  HENT: Negative.  Negative for congestion, ear discharge and ear pain.   Eyes:  Negative for pain, discharge and redness.  Respiratory:  Negative for cough, sputum production, shortness of breath and wheezing.   Cardiovascular: Negative.  Negative for chest pain and palpitations.  Gastrointestinal:  Negative for abdominal pain, blood in stool, constipation, diarrhea, heartburn, nausea and vomiting.  Skin: Negative.  Negative for itching and rash.  Neurological:  Negative for dizziness and headaches.  Endo/Heme/Allergies:  Negative for environmental allergies. Does not bruise/bleed easily.      Objective:   Vitals within normal limits.   Physical Exam Vitals reviewed.  Constitutional:      Appearance: She is well-developed.     Comments: Talkative female.  HENT:     Head: Normocephalic and atraumatic.     Right Ear: Tympanic membrane, ear canal and external ear normal.     Left Ear: Tympanic membrane, ear canal and external ear normal.     Nose: Mucosal edema present. No nasal deformity, septal deviation or rhinorrhea.     Right Turbinates: Enlarged. Not swollen.     Left Turbinates: Enlarged. Not swollen.     Right Sinus: No maxillary sinus tenderness or frontal sinus tenderness.     Left Sinus: No maxillary sinus tenderness or frontal sinus tenderness.     Mouth/Throat:     Mouth: Mucous membranes are not pale and not dry.     Pharynx: Uvula midline.     Comments: Cobblestoning present in the posterior oropharynx. Eyes:     General: Lids are normal. Allergic shiner present.        Right eye: No discharge.        Left eye: No discharge.     Conjunctiva/sclera: Conjunctivae normal.     Right eye: Right conjunctiva is not injected. No chemosis.    Left eye: Left conjunctiva is not injected. No chemosis.    Pupils: Pupils are equal, round, and reactive to light.  Cardiovascular:     Rate and Rhythm:  Normal rate and regular rhythm.     Heart sounds: Normal heart sounds.  Pulmonary:     Effort: Pulmonary effort is normal. No tachypnea, accessory muscle usage or respiratory distress.     Breath sounds: Normal breath sounds. No wheezing, rhonchi or rales.     Comments: Moving air well in all lung fields.  No increased work of breathing. Chest:     Chest wall: No tenderness.  Lymphadenopathy:     Cervical: No cervical adenopathy.  Skin:    Coloration: Skin is not pale.     Findings: No abrasion, erythema, petechiae or rash. Rash is not papular, urticarial or vesicular.  Neurological:     Mental Status: She is alert.  Psychiatric:        Behavior: Behavior is cooperative.     Diagnostic studies:    Spirometry: results normal (FEV1: 2.12/77%, FVC: 2.91/82%, FEV1/FVC: 73%).  Spirometry consistent with normal pattern.   Allergy Studies: none        Malachi BondsJoel Farah Lepak, MD  [facility]

## 2021-05-24 NOTE — Patient Instructions (Addendum)
1. Severe persistent asthma without complication - Lung testing looked decent. - We are not going to make any changes at all.  - Daily controller medication(s): Singulair 10mg  daily, Symbicort 160/4.54mcg two puffs twice daily with spacer, and Spiriva 4m daily  in combination with Fasenra every 2 months - Prior to physical activity: albuterol 2 puffs 10-15 minutes before physical activity. - Rescue medications: albuterol 4 puffs every 4-6 hours as needed - Asthma control goals:  * Full participation in all desired activities (may need albuterol before activity) * Albuterol use two time or less a week on average (not counting use with activity) * Cough interfering with sleep two time or less a month * Oral steroids no more than once a year * No hospitalizations  2. Allergic rhinitis - Stop the Flonase and start Xhance one spray per nostril twice daily. - This reaches deeper into the nose and should help with Eustachian tube drainage. - Continue with the Singulair 10mg  daily. - Continue with the over the counter antihistamine as needed.   3. Return in about 6 months (around 11/21/2021).    Please inform of any Emergency Department visits, hospitalizations, or changes in symptoms. Call 11/23/2021 before going to the ED for breathing or allergy symptoms since we might be able to fit you in for a sick visit. Feel free to contact us anytime with any questions, problems, or concerns.  It was a pleasure to meet you today!  Websites that have reliable patient information: 1. American Academy of Asthma, Allergy, and Immunology: www.aaaai.org 2. Food Allergy Research and Education (FARE): foodallergy.org 3. Mothers of Asthmatics: http://www.asthmacommunitynetwork.org 4. American College of Allergy, Asthma, and Immunology: www.acaai.org   COVID-19 Vaccine Information can be found at: [place] For questions related to vaccine  distribution or appointments, please email [email or web address] or call [phone].   We realize that you might be concerned about having an allergic reaction to the COVID19 vaccines. To help with that concern, WE ARE OFFERING THE COVID19 VACCINES IN OUR OFFICE! Ask the front desk for dates!     Like [email or web address] on [phone] and Instagram for our latest updates!      A healthy democracy works best when [place] participate! Make sure you are registered to vote! If you have moved or changed any of your contact information, you will need to get this updated before voting!  In some cases, you MAY be able to register to vote online: [employer]

## 2021-06-03 ENCOUNTER — Telehealth: Payer: Self-pay | Admitting: Allergy & Immunology

## 2021-06-03 NOTE — Telephone Encounter (Signed)
Is it ok to send in a prescription for xhance?

## 2021-06-03 NOTE — Telephone Encounter (Signed)
Patient states the Stephanie Duncan is working very well and is requesting a prescription to be sent. She states all her medication from her last OV (1/24) were sent to her old pharmacy but needs them sent to [facility] [place] on [address].

## 2021-06-06 MED ORDER — XHANCE 93 MCG/ACT NA EXHU: 2.0000 | INHALANT_SUSPENSION | Freq: Two times a day (BID) | NASAL | 5 refills | Status: DC

## 2021-06-06 NOTE — Telephone Encounter (Addendum)
Sent in rx of [facility]hance [facility] informed pt of rx

## 2021-06-06 NOTE — Addendum Note (Signed)
Addended by: Berna Bue on: 06/06/2021 05:02 PM   Modules accepted: Orders

## 2021-06-06 NOTE — Telephone Encounter (Signed)
Of course! Thank you!   Malachi Bonds, MD [facility]

## 2021-06-07 ENCOUNTER — Other Ambulatory Visit: Payer: Self-pay

## 2021-06-23 ENCOUNTER — Other Ambulatory Visit: Payer: Self-pay

## 2021-06-23 ENCOUNTER — Telehealth: Payer: Self-pay | Admitting: *Deleted

## 2021-06-23 ENCOUNTER — Ambulatory Visit (INDEPENDENT_AMBULATORY_CARE_PROVIDER_SITE_OTHER): Payer: 59 | Admitting: *Deleted

## 2021-06-23 DIAGNOSIS — J4550 Severe persistent asthma, uncomplicated: Secondary | ICD-10-CM | POA: Diagnosis not present

## 2021-06-23 NOTE — Telephone Encounter (Signed)
PA has been submitted through CoverMyMeds for Xhance and is currently pending approval/denial.  

## 2021-06-27 ENCOUNTER — Telehealth: Payer: Self-pay

## 2021-06-27 NOTE — Telephone Encounter (Signed)
PA is still pending.  

## 2021-06-27 NOTE — Telephone Encounter (Signed)
Patient called asking if her Allena Earing was here for her next appointment. I check and did not see any medication for patient. She stated that it was delivered on Thursday 06/23/2021. I check our log in record and it was delivered on 06/22/2021. Patient came in on 06/23/2021 and received her Fasenra injection. Patient stated that she should have another one in office for her next appointment. Patient stated that for the last year she has had one in office for her appointment and one on the way. She doesn't understanding why she doesn't have a dose in office for her next appointment on 08/18/2021. Tammy can you please look into this. Patient would like a return call from Tammy regarding this matter.

## 2021-06-27 NOTE — Telephone Encounter (Signed)
Called patient and advised will look into shipment and doses in clinic

## 2021-07-05 NOTE — Telephone Encounter (Signed)
Will re-submit PA for [facility].  ?

## 2021-07-13 NOTE — Telephone Encounter (Signed)
L/m advising patient extra fasenra dose found in [facility] ?

## 2021-07-18 NOTE — Telephone Encounter (Signed)
Was PA re-submitted? ?

## 2021-07-18 NOTE — Telephone Encounter (Signed)
Re-Attempted PA through CoverMyMeds. It stated that the particular plan did not do medication prior authorizations. I will call the pharmacy tomorrow to get the correct insurance information to resubmit.  ?

## 2021-07-19 NOTE — Telephone Encounter (Signed)
Form has been filled out, will get Dr. Ernst Bowler to sign in the next couple of days and fax to patients insurance.  ?

## 2021-07-19 NOTE — Telephone Encounter (Signed)
Called the pharmacy and obtained insurance information to initiate PA. Still got the same response through CoverMyMeds, it did refer me to the insurance website where I was able to locate and print a PA form. Will fill this out and work on getting it faxed to insurance.  ?

## 2021-07-21 NOTE — Telephone Encounter (Signed)
PA has been faxed to patients insurance at [phone] and is currently pending.  ?

## 2021-07-25 NOTE — Telephone Encounter (Signed)
PA was denied by insurance stating that Flonase is over the counter so therefore it is not covered. Would you like to send in an alternative nasal spray?  ?

## 2021-07-26 MED ORDER — RYALTRIS 665-25 MCG/ACT NA SUSP: 1.0000 | Freq: Two times a day (BID) | NASAL | 5 refills | Status: DC

## 2021-07-26 NOTE — Telephone Encounter (Addendum)
Called and spoke to patient and informed her of the message per Dr. Dellis Anes. I have sent in Ryaltris for patient and informed her of the speciality pharmacy. I also put a sample in the front for patient to pick up. Patient stated that she would come and pick it up this evening.  ?

## 2021-07-26 NOTE — Addendum Note (Signed)
Addended by: Robet Leu A on: 07/26/2021 08:34 AM ? ? Modules accepted: Orders ? ?

## 2021-07-26 NOTE — Telephone Encounter (Signed)
Let's just do Ryaltris one spray per nostril BID.  ? ?Malachi Bonds, MD ?[facility] ? ?

## 2021-08-18 ENCOUNTER — Ambulatory Visit (INDEPENDENT_AMBULATORY_CARE_PROVIDER_SITE_OTHER): Payer: 59

## 2021-08-18 DIAGNOSIS — J4550 Severe persistent asthma, uncomplicated: Secondary | ICD-10-CM | POA: Diagnosis not present

## 2021-10-13 ENCOUNTER — Ambulatory Visit (INDEPENDENT_AMBULATORY_CARE_PROVIDER_SITE_OTHER): Payer: 59

## 2021-10-13 DIAGNOSIS — J4550 Severe persistent asthma, uncomplicated: Secondary | ICD-10-CM

## 2021-11-22 ENCOUNTER — Encounter: Payer: Self-pay | Admitting: Allergy & Immunology

## 2021-11-22 ENCOUNTER — Ambulatory Visit (INDEPENDENT_AMBULATORY_CARE_PROVIDER_SITE_OTHER): Payer: 59 | Admitting: Allergy & Immunology

## 2021-11-22 VITALS — BP 126/70 | HR 71 | Temp 98.3°F | Resp 12 | Wt 171.4 lb

## 2021-11-22 DIAGNOSIS — J3089 Other allergic rhinitis: Secondary | ICD-10-CM | POA: Diagnosis not present

## 2021-11-22 DIAGNOSIS — J4550 Severe persistent asthma, uncomplicated: Secondary | ICD-10-CM | POA: Diagnosis not present

## 2021-11-22 NOTE — Progress Notes (Signed)
FOLLOW UP  Date of Service/Encounter:  11/22/21   Assessment:   Severe persistent asthma, uncomplicated - doing very well on the Fasenra without any systemic steroids since starting it  History of 2-3 intubations in the past   Perennial and seasonal allergic rhinitis - with improvement in ear fullness while on Xhance (changed back to Flonase due to excessive drying)   GERD    Plan/Recommendations:   1. Severe persistent asthma without complication - Lung testing looked good today. - We are going to try to simplify (and make it more affordable) you regimen!  - Stop the Spiriva and we will start Breztri instead (contains three medications to keep your lungs open and inflammation under control).  - Daily controller medication(s): Singulair 10mg  dailyin combination with Breztri 1-2 puffs twice daily and Fasenra every 2 months - Prior to physical activity: albuterol 2 puffs 10-15 minutes before physical activity. - Rescue medications: albuterol 4 puffs every 4-6 hours as needed - Asthma control goals:  * Full participation in all desired activities (may need albuterol before activity) * Albuterol use two time or less a week on average (not counting use with activity) * Cough interfering with sleep two time or less a month * Oral steroids no more than once a year * No hospitalizations  2. Allergic rhinitis - Continue with Flonase (change to [name] with severe symptoms).  - You seem to have a good handle on your symptoms.  - Continue with the Singulair 10mg  daily. - Add on Allegra to see if this helps with the throat clearing. - Otherwise we can try carbinoxamine (can cause sleepiness but it also dries out the nasal passages).   3. Return in about 6 months (around 05/25/2022).   Subjective:   Stephanie Duncan is a 61 y.o. female presenting today for follow up of  Chief Complaint  Patient presents with   Asthma    No flare ups to report.   Other    Tried xhance for a couple of  months up it dried out her nasal passages. Has not had any issues with the ears since. Went back to Stephanie Duncan.  Something like a spasm in her throat which makes it hard for her to breathe and it wakes her up at night. No trouble swallowing food. Tries to swallow until it clears. Happens randomly while awake and asleep.    Stephanie Duncan has a history of the following: Patient Active Problem List   Diagnosis Date Noted   Not well controlled severe persistent asthma 03/04/2020   Other allergic rhinitis 03/04/2020   Heartburn 03/04/2020   Unspecified hypothyroidism 01/16/2013   Depressive disorder, not elsewhere classified 01/16/2013   Asthma, chronic 01/16/2013    History obtained from: chart review and patient.  Stephanie Duncan is a 61 y.o. female presenting for a follow up visit. She was last seen in January 2023. At that time, lung testing looked decent. We did not make any changes at all. We continued with the use of montelukast as well as Symbicort and Spiriva. She also remained on Fasenra. For her rhinitis, we stopped the fluticasone and replaced it with Xhance one spray per nostril BID. She was reporting some ear fullness and we hoped that this would help to resolve it.   Since the last visit, she has done very well.   Asthma/Respiratory Symptom History: She remains on her February 2023. She uses the Symbicort every morning and every night. She is not using the Spiriva.  She is not sure  whether it is providing much help with the addition of the [name], so she just stopped taking it. Spiriva is fairly affordable for her, but the Symbicort is nearly $75 per month. She is open to making some changes to make her regimen more affordable.   Allergic Rhinitis Symptom History: Stephanie Duncan was drying her out too much. She is back on the Flonase.  She has been having some postnasal drip. She does have some GERD and she takes Prilosec daily. We originally started the [name] due to chronic ear fullness. It did clear all of  that up, but then it dried it up too much. She has been back on the Flonase for around 3 months. She has not had recurrence of her ear fullness. Stephanie Duncan was around $100 per month. Symbicort is $75 each month.   GERD Symptom History: She does take  omeprazole 20mg  daily. This seems to control her symptoms effectively.  They did take a trip to this summer. She seems to have had a good time.   Otherwise, there have been no changes to her past medical history, surgical history, family history, or social history.    Review of Systems  Constitutional: Negative.  Negative for chills, fever, malaise/fatigue and weight loss.  HENT: Negative.  Negative for congestion, ear discharge, ear pain and sinus pain.   Eyes:  Negative for pain, discharge and redness.  Respiratory:  Negative for cough, sputum production, shortness of breath and wheezing.   Cardiovascular: Negative.  Negative for chest pain and palpitations.  Gastrointestinal:  Negative for abdominal pain, constipation, diarrhea, heartburn, nausea and vomiting.  Skin: Negative.  Negative for itching and rash.  Neurological:  Negative for dizziness and headaches.  Endo/Heme/Allergies:  Negative for environmental allergies. Does not bruise/bleed easily.       Objective:   Blood pressure 126/70, pulse 71, temperature 98.3 F (36.8 C), temperature source Temporal, resp. rate 12, weight 171 lb 6.4 oz (77.7 kg), SpO2 98 %. Body mass index is 28.3 kg/m.    Physical Exam Vitals reviewed.  Constitutional:      Appearance: She is well-developed.     Comments: Talkative female.  HENT:     Head: Normocephalic and atraumatic.     Right Ear: Tympanic membrane, ear canal and external ear normal.     Left Ear: Tympanic membrane, ear canal and external ear normal.     Nose: Mucosal edema present. No nasal deformity, septal deviation or rhinorrhea.     Right Turbinates: Enlarged and swollen.     Left Turbinates: Enlarged and swollen.      Right Sinus: No maxillary sinus tenderness or frontal sinus tenderness.     Left Sinus: No maxillary sinus tenderness or frontal sinus tenderness.     Mouth/Throat:     Mouth: Mucous membranes are not pale and not dry.     Pharynx: Uvula midline.     Comments: Cobblestoning present in the posterior oropharynx. Eyes:     General: Lids are normal. Allergic shiner present.        Right eye: No discharge.        Left eye: No discharge.     Conjunctiva/sclera: Conjunctivae normal.     Right eye: Right conjunctiva is not injected. No chemosis.    Left eye: Left conjunctiva is not injected. No chemosis.    Pupils: Pupils are equal, round, and reactive to light.  Cardiovascular:     Rate and Rhythm: Normal rate and regular rhythm.  Heart sounds: Normal heart sounds.  Pulmonary:     Effort: Pulmonary effort is normal. No tachypnea, accessory muscle usage or respiratory distress.     Breath sounds: Normal breath sounds. No wheezing, rhonchi or rales.     Comments: Moving air well in all lung fields.  No increased work of breathing. Chest:     Chest wall: No tenderness.  Lymphadenopathy:     Cervical: No cervical adenopathy.  Skin:    Coloration: Skin is not pale.     Findings: No abrasion, erythema, petechiae or rash. Rash is not papular, urticarial or vesicular.  Neurological:     Mental Status: She is alert.  Psychiatric:        Behavior: Behavior is cooperative.      Diagnostic studies:    Spirometry: results normal (FEV1: 2.27/83%, FVC: 2.87/82%, FEV1/FVC: 79%).    Spirometry consistent with normal pattern.    Allergy Studies: none        Malachi Bonds, MD  [facility]

## 2021-11-22 NOTE — Patient Instructions (Addendum)
1. Severe persistent asthma without complication - Lung testing looked good today. - We are going to try to simplify (and make it more affordable) you regimen!  - Stop the Spiriva and we will start Breztri instead (contains three medications to keep your lungs open and inflammation under control).  - Daily controller medication(s): Singulair 10mg  dailyin combination with Breztri 1-2 puffs twice daily and Fasenra every 2 months - Prior to physical activity: albuterol 2 puffs 10-15 minutes before physical activity. - Rescue medications: albuterol 4 puffs every 4-6 hours as needed - Asthma control goals:  * Full participation in all desired activities (may need albuterol before activity) * Albuterol use two time or less a week on average (not counting use with activity) * Cough interfering with sleep two time or less a month * Oral steroids no more than once a year * No hospitalizations  2. Allergic rhinitis - Continue with Flonase (change to [facility] with severe symptoms).  - You seem to have a good handle on your symptoms.  - Continue with the Singulair 10mg  daily. - Add on Allegra to see if this helps with the throat clearing. - Otherwise we can try carbinoxamine (can cause sleepiness but it also dries out the nasal passages). h  3. Return in about 6 months (around 05/25/2022).    Please inform of any Emergency Department visits, hospitalizations, or changes in symptoms. Call 05/27/2022 before going to the ED for breathing or allergy symptoms since we might be able to fit you in for a sick visit. Feel free to contact us anytime with any questions, problems, or concerns.  It was a pleasure to see you today!  Websites that have reliable patient information: 1. American Academy of Asthma, Allergy, and Immunology: www.aaaai.org 2. Food Allergy Research and Education (FARE): foodallergy.org 3. Mothers of Asthmatics: http://www.asthmacommunitynetwork.org 4. American College of Allergy, Asthma, and  Immunology: www.acaai.org   COVID-19 Vaccine Information can be found at: [place] For questions related to vaccine distribution or appointments, please email [email or web address] or call [phone].   We realize that you might be concerned about having an allergic reaction to the COVID19 vaccines. To help with that concern, WE ARE OFFERING THE COVID19 VACCINES IN OUR OFFICE! Ask the front desk for dates!     "Like" [email or web address] on Facebook and Instagram for our latest updates!      A healthy democracy works best when [phone] participate! Make sure you are registered to vote! If you have moved or changed any of your contact information, you will need to get this updated before voting!  In some cases, you MAY be able to register to vote online: [place]

## 2021-12-08 ENCOUNTER — Ambulatory Visit (INDEPENDENT_AMBULATORY_CARE_PROVIDER_SITE_OTHER): Payer: 59

## 2021-12-08 DIAGNOSIS — J4550 Severe persistent asthma, uncomplicated: Secondary | ICD-10-CM | POA: Diagnosis not present

## 2022-01-09 ENCOUNTER — Telehealth: Payer: Self-pay

## 2022-01-09 ENCOUNTER — Ambulatory Visit (INDEPENDENT_AMBULATORY_CARE_PROVIDER_SITE_OTHER): Payer: 59 | Admitting: Allergy

## 2022-01-09 ENCOUNTER — Encounter: Payer: Self-pay | Admitting: Allergy

## 2022-01-09 VITALS — BP 130/90 | HR 68 | Temp 98.2°F | Resp 16 | Wt 176.4 lb

## 2022-01-09 DIAGNOSIS — J339 Nasal polyp, unspecified: Secondary | ICD-10-CM | POA: Diagnosis not present

## 2022-01-09 DIAGNOSIS — J4550 Severe persistent asthma, uncomplicated: Secondary | ICD-10-CM

## 2022-01-09 DIAGNOSIS — J310 Chronic rhinitis: Secondary | ICD-10-CM | POA: Insufficient documentation

## 2022-01-09 MED ORDER — XHANCE 93 MCG/ACT NA EXHU: INHALANT_SUSPENSION | NASAL | 5 refills | Status: DC

## 2022-01-09 MED ORDER — BREZTRI AEROSPHERE 160-9-4.8 MCG/ACT IN AERO: 2.0000 | INHALATION_SPRAY | Freq: Two times a day (BID) | RESPIRATORY_TRACT | 3 refills | Status: DC

## 2022-01-09 NOTE — Assessment & Plan Note (Signed)
Controlled. Never tried Clinical cytogeneticist. . Daily controller medication(s): start Breztri 2 puffs twice a day with spacer and rinse mouth afterwards. Sample given. o If this does not work as well or if it's not covered go back to Symbicort 2 puffs twice a day and Spiriva. . Continue Fasenra injections every 8 week. . Continue Singulair (montelukast) 10mg  daily at night. . May use albuterol rescue inhaler 2 puffs or nebulizer every 4 to 6 hours as needed for shortness of breath, chest tightness, coughing, and wheezing. May use albuterol rescue inhaler 2 puffs 5 to 15 minutes prior to strenuous physical activities. Monitor frequency of use.

## 2022-01-09 NOTE — Patient Instructions (Signed)
Sinus Start prednisone taper. Prednisone 10mg  tablets - take 2 tablets for 4 days then 1 tablet on day 5.  Start Xhance (fluticasone) nasal spray 2 sprays per nostril twice a day as needed for nasal congestion.  If this is not covered let know.  No sample available today at the office.   Refer to ENT for chronic rhinitis  - rule out nasal polyps.   Asthma Daily controller medication(s): start Breztri 2 puffs twice a day with spacer and rinse mouth afterwards. Sample given. If this does not work as well or if it's not covered go back to Symbicort [place] 2 puffs twice a day and Spiriva. Continue Fasenra injections every 8 week.  Continue Singulair (montelukast) 10mg  daily at night. May use albuterol rescue inhaler 2 puffs or nebulizer every 4 to 6 hours as needed for shortness of breath, chest tightness, coughing, and wheezing. May use albuterol rescue inhaler 2 puffs 5 to 15 minutes prior to strenuous physical activities. Monitor frequency of use.  Asthma control goals:  Full participation in all desired activities (may need albuterol before activity) Albuterol use two times or less a week on average (not counting use with activity) Cough interfering with sleep two times or less a month Oral steroids no more than once a year No hospitalizations   Follow up with Dr. in January 2024.  Our [place] office is moving in September 2023 to a new location. New address: [address] [place], [place], [place] (white building). [place] office: [phone] (same phone number).

## 2022-01-09 NOTE — Progress Notes (Signed)
Follow Up Note  RE: Stephanie Duncan MRN: [id number] DOB: April 28, 1961 Date of Office Visit: 01/09/2022  Referring provider: Aliene Beams, MD Primary care provider: Joaquin Music, NP  Chief Complaint: Other (Puffy eyes, dark circles under eyes. Waking up with headaches, left nostril feels like there is something stuck in there making it stuffy. Taking all medications but nothing seems to help. What is the next step? /Patient lost Breztri sample given to her last visit.)  History of Present Illness: I had the pleasure of seeing Stephanie Duncan for a follow up visit at the [facility] on 01/09/2022. She is a 61 y.o. female, who is being followed for asthma on Fasenra, allergic rhinitis. Her previous allergy office visit was on 11/22/2021 with Dr. Dellis Anes. Today is a new complaint visit of allergy flare .  Patient noted itchy/swollen eyes, left nostril nasal congestion persistent all summer. She feels like there is something stuck in the left side.   Currently taking montelukast 10mg  daily, Flonase 2 sprays per nostril BID, allegra-D sometimes.  No prior ENT evaluation. No prior sinus surgery.  She is not sure what happened with [facility] as she used to be on it.   Asthma  Currently on Symbicort [facility] 2 puffs BID. Never started Breztri as she lost it after the last visit.  No issues with her asthma.  Noted hair loss and having some fatigue - has appointment with PCP later this month.   She has taken low dose prednisone with no issues.   Assessment and Plan: [name] is a 61 y.o. female with: Chronic rhinitis Persistent left sided nasal congestion. Flonase, and Singulair ineffective. Used to be on [place]. No prior ENT evaluation. 2021 bloodwork was negative to environmental panel. Start prednisone taper. Prednisone 10mg  tablets - take 2 tablets for 4 days then 1 tablet on day 5.  Start Xhance (fluticasone) nasal spray 2 sprays per nostril twice a day as needed for nasal congestion.   If this is not covered let 2022 know.  No sample available today at the office.  Refer to ENT for chronic rhinitis  - rule out nasal polyps.  Severe persistent asthma without complication Controlled. Never tried . Daily controller medication(s): start Breztri 2 puffs twice a day with spacer and rinse mouth afterwards. Sample given. If this does not work as well or if it's not covered go back to Symbicort [place] 2 puffs twice a day and Spiriva. Continue Fasenra injections every 8 week. Continue Singulair (montelukast) 10mg  daily at night. May use albuterol rescue inhaler 2 puffs or nebulizer every 4 to 6 hours as needed for shortness of breath, chest tightness, coughing, and wheezing. May use albuterol rescue inhaler 2 puffs 5 to 15 minutes prior to strenuous physical activities. Monitor frequency of use.   Return in about 4 months (around 05/11/2022).  Follow up with PCP regarding hair loss and fatigue.  Meds ordered this encounter  Medications   Budeson-Glycopyrrol-Formoterol (BREZTRI AEROSPHERE) 160-9-4.8 MCG/ACT AERO    Sig: Inhale 2 puffs into the lungs in the morning and at bedtime. with spacer and rinse mouth afterwards.    Dispense:  10.7 g    Refill:  3   Fluticasone Propionate (XHANCE) 93 MCG/ACT EXHU    Sig: 1-2 sprays per nostril twice a day.    Dispense:  32 mL    Refill:  5    [phone]   Lab Orders  No laboratory test(s) ordered today    Diagnostics: None.  Medication List:  Current Outpatient Medications  Medication Sig Dispense Refill   albuterol (PROVENTIL) (2.5 MG/3ML) 0.083% nebulizer solution Inhale into the lungs.     albuterol (VENTOLIN HFA) 108 (90 Base) MCG/ACT inhaler Inhale 1-2 puffs into the lungs every 4 (four) hours as needed for wheezing or shortness of breath. 1 each 2   ALPRAZolam (XANAX) 0.25 MG tablet Take by mouth.     atorvastatin (LIPITOR) 40 MG tablet Take by mouth.     Budeson-Glycopyrrol-Formoterol (BREZTRI AEROSPHERE)  160-9-4.8 MCG/ACT AERO Inhale 2 puffs into the lungs in the morning and at bedtime. with spacer and rinse mouth afterwards. 10.7 g 3   budesonide-formoterol (SYMBICORT) 160-4.5 MCG/ACT inhaler 2 puffs     cetirizine (ZYRTEC) 10 MG tablet Take 10 mg by mouth daily.     citalopram (CELEXA) 20 MG tablet Take 20 mg by mouth daily.     EPINEPHrine 0.3 mg/0.3 mL IJ SOAJ injection Inject into the muscle.     FASENRA 30 MG/ML SOSY INJECT THE CONTENTS OF 1 SYRINGE (30 MG) UNDER THE SKIN EVERY 8 WEEKS 1 mL 6   Fluticasone Propionate (XHANCE) 93 MCG/ACT EXHU 1-2 sprays per nostril twice a day. 32 mL 5   Fluticasone Propionate, Inhal, 250 MCG/BLIST AEPB Inhale into the lungs.     levothyroxine (SYNTHROID, LEVOTHROID) 25 MCG tablet Take 1 tablet (25 mcg total) by mouth daily before breakfast. 30 tablet 3   montelukast (SINGULAIR) 10 MG tablet 1 tablet     omeprazole (PRILOSEC) 20 MG capsule 1 capsule 30 minutes before morning meal     Semaglutide-Weight Management (WEGOVY) 0.5 MG/0.5ML SOAJ 0.5 mL     TUBERCULIN SYR 1CC/27GX1/2" 27G X 1/2" 1 ML MISC 2 application by Misc.(Non-Drug; Combo Route) route once a week.     Vitamin D, Ergocalciferol, (DRISDOL) 1.25 MG (50000 UNIT) CAPS capsule TAKE 1 CAPSULE BY MOUTH ONE TIME PER WEEK FOR 30 DAYS     tiotropium (SPIRIVA HANDIHALER) 18 MCG inhalation capsule 1 capsule by inhaling the contents of the capsule using the HandiHaler device (Patient not taking: Reported on 01/09/2022)     Current Facility-Administered Medications  Medication Dose Route Frequency Provider Last Rate Last Admin   Benralizumab SOSY 30 mg  30 mg Subcutaneous Q8 Thomes Dinning, MD   30 mg at 12/08/21 1638   Allergies: Allergies  Allergen Reactions   Influenza Vaccines Hives, Itching, Rash and Swelling   Other Swelling    FLU VACC.   Prednisone     Swelling,aching   I reviewed her past medical history, social history, family history, and environmental history and no  significant changes have been reported from her previous visit.  Review of Systems  Constitutional:  Positive for fatigue. Negative for appetite change, chills, fever and unexpected weight change.  HENT:  Positive for congestion. Negative for rhinorrhea.   Eyes:  Positive for itching.  Respiratory:  Negative for cough, chest tightness, shortness of breath and wheezing.   Gastrointestinal:  Negative for abdominal pain.  Skin:  Negative for rash.  Allergic/Immunologic: Positive for environmental allergies.  Neurological:  Positive for headaches.    Objective: BP (!) 130/90   Pulse 68   Temp 98.2 F (36.8 C) (Temporal)   Resp 16   Wt 176 lb 6.4 oz (80 kg)   SpO2 97%   BMI 29.13 kg/m  Body mass index is 29.13 kg/m. Physical Exam Vitals and nursing note reviewed.  Constitutional:      Appearance: Normal appearance.  She is well-developed.  HENT:     Head: Normocephalic and atraumatic.     Right Ear: Tympanic membrane and external ear normal.     Left Ear: Tympanic membrane and external ear normal.     Nose: Nose normal.     Mouth/Throat:     Mouth: Mucous membranes are moist.     Pharynx: Oropharynx is clear.  Eyes:     Conjunctiva/sclera: Conjunctivae normal.  Cardiovascular:     Rate and Rhythm: Normal rate and regular rhythm.     Heart sounds: Normal heart sounds. No murmur heard. Pulmonary:     Effort: Pulmonary effort is normal.     Breath sounds: Normal breath sounds. No wheezing, rhonchi or rales.  Musculoskeletal:     Cervical back: Neck supple.  Skin:    General: Skin is warm.     Findings: No rash.  Neurological:     Mental Status: She is alert and oriented to person, place, and time.  Psychiatric:        Behavior: Behavior normal.    Previous notes and tests were reviewed. The plan was reviewed with the patient/family, and all questions/concerned were addressed.  It was my pleasure to see Kristalyn today and participate in her care. Please feel free to  contact me with any questions or concerns.  Sincerely,  Wyline Mood, DO Allergy & Immunology  [facility] office: [phone] [facility] office: [phone]

## 2022-01-09 NOTE — Telephone Encounter (Signed)
Please refer this patient to ENT per Dr. Selena Batten for chronic rhinitis- rule out nasal polyps.

## 2022-01-09 NOTE — Assessment & Plan Note (Signed)
Persistent left sided nasal congestion. Flonase, and Singulair ineffective. Used to be on [place]. No prior ENT evaluation. 2021 bloodwork was negative to environmental panel. . Start prednisone taper. Prednisone 10mg  tablets - take 2 tablets for 4 days then 1 tablet on day 5.  . Start Xhance (fluticasone) nasal spray 2 sprays per nostril twice a day as needed for nasal congestion.  . If this is not covered let know.  . No sample available today at the office.  [place] Refer to ENT for chronic rhinitis  - rule out nasal polyps.

## 2022-01-17 NOTE — Telephone Encounter (Signed)
I called and left a message for the patient to see if she is interested in seeing a ENT in [place] or [place]. Patient is closer to [facility].

## 2022-01-20 NOTE — Telephone Encounter (Signed)
Called and left a voicemail asking for patient to return call to discuss.  °

## 2022-01-27 NOTE — Telephone Encounter (Signed)
Called and left a voicemail asking for return call to discuss.  ?

## 2022-02-02 ENCOUNTER — Ambulatory Visit (INDEPENDENT_AMBULATORY_CARE_PROVIDER_SITE_OTHER): Payer: 59

## 2022-02-02 DIAGNOSIS — J4550 Severe persistent asthma, uncomplicated: Secondary | ICD-10-CM | POA: Diagnosis not present

## 2022-03-30 ENCOUNTER — Ambulatory Visit (INDEPENDENT_AMBULATORY_CARE_PROVIDER_SITE_OTHER): Payer: 59

## 2022-03-30 DIAGNOSIS — J4550 Severe persistent asthma, uncomplicated: Secondary | ICD-10-CM | POA: Diagnosis not present

## 2022-05-09 ENCOUNTER — Other Ambulatory Visit: Payer: Self-pay | Admitting: Allergy

## 2022-05-25 ENCOUNTER — Ambulatory Visit (INDEPENDENT_AMBULATORY_CARE_PROVIDER_SITE_OTHER): Payer: 59

## 2022-05-25 DIAGNOSIS — J4550 Severe persistent asthma, uncomplicated: Secondary | ICD-10-CM

## 2022-06-01 ENCOUNTER — Ambulatory Visit: Payer: 59 | Admitting: Allergy & Immunology

## 2022-06-06 ENCOUNTER — Ambulatory Visit: Payer: 59 | Admitting: Allergy & Immunology

## 2022-07-20 ENCOUNTER — Ambulatory Visit (INDEPENDENT_AMBULATORY_CARE_PROVIDER_SITE_OTHER): Payer: 59

## 2022-07-20 DIAGNOSIS — J4550 Severe persistent asthma, uncomplicated: Secondary | ICD-10-CM | POA: Diagnosis not present

## 2022-08-30 ENCOUNTER — Ambulatory Visit: Payer: 59 | Admitting: Allergy

## 2022-09-14 ENCOUNTER — Ambulatory Visit (INDEPENDENT_AMBULATORY_CARE_PROVIDER_SITE_OTHER): Payer: 59

## 2022-09-14 DIAGNOSIS — J4550 Severe persistent asthma, uncomplicated: Secondary | ICD-10-CM

## 2022-09-27 ENCOUNTER — Telehealth: Payer: Self-pay | Admitting: Allergy

## 2022-11-19 NOTE — Progress Notes (Unsigned)
Follow Up Note  RE: Stephanie Duncan MRN: [id number] DOB: [birth year] Date of Office Visit: 11/20/2022  Referring provider: Joaquin Music, NP Primary care provider: Joaquin Music, NP  Chief Complaint: No chief complaint on file.  History of Present Illness: I had the pleasure of seeing Stephanie Duncan for a follow up visit at the [facility] on 11/19/2022. She is a 62 y.o. female, who is being followed for chronic rhinitis and asthma on Fasenra. Her previous allergy office visit was on 01/09/2022 with Dr. Selena Batten. Today is a regular follow up visit.  Chronic rhinitis Persistent left sided nasal congestion. Flonase, and Singulair ineffective. Used to be on [place]. No prior ENT evaluation. 2021 bloodwork was negative to environmental panel. Start prednisone taper. Prednisone 10mg  tablets - take 2 tablets for 4 days then 1 tablet on day 5.  Start Xhance (fluticasone) nasal spray 2 sprays per nostril twice a day as needed for nasal congestion.  If this is not covered let us know.  No sample available today at the office.  Refer to ENT for chronic rhinitis  - rule out nasal polyps.   Severe persistent asthma without complication Controlled. Never tried Clinical cytogeneticist. Daily controller medication(s): start Breztri 2 puffs twice a day with spacer and rinse mouth afterwards. Sample given. If this does not work as well or if it's not covered go back to Symbicort 2 puffs twice a day and Spiriva. Continue Fasenra injections every 8 week. Continue Singulair (montelukast) 10mg  daily at night. May use albuterol rescue inhaler 2 puffs or nebulizer every 4 to 6 hours as needed for shortness of breath, chest tightness, coughing, and wheezing. May use albuterol rescue inhaler 2 puffs 5 to 15 minutes prior to strenuous physical activities. Monitor frequency of use.    Return in about 4 months (around 05/11/2022).   Follow up with PCP regarding hair loss and fatigue.  Assessment and Plan: Stephanie Duncan is a  62 y.o. female with: No problem-specific Assessment & Plan notes found for this encounter.  No follow-ups on file.  No orders of the defined types were placed in this encounter.  Lab Orders  No laboratory test(s) ordered today    Diagnostics: Spirometry:  Tracings reviewed. Her effort: {Blank single:19197::"Good reproducible efforts.","It was hard to get consistent efforts and there is a question as to whether this reflects a maximal maneuver.","Poor effort, data can not be interpreted."} FVC: ***L FEV1: ***L, ***% predicted FEV1/FVC ratio: ***% Interpretation: {Blank single:19197::"Spirometry consistent with mild obstructive disease","Spirometry consistent with moderate obstructive disease","Spirometry consistent with severe obstructive disease","Spirometry consistent with possible restrictive disease","Spirometry consistent with mixed obstructive and restrictive disease","Spirometry uninterpretable due to technique","Spirometry consistent with normal pattern","No overt abnormalities noted given today's efforts"}.  Please see scanned spirometry results for details.  Skin Testing: {Blank single:19197::"Select foods","Environmental allergy panel","Environmental allergy panel and select foods","Food allergy panel","None","Deferred due to recent antihistamines use"}. *** Results discussed with patient/family.   Medication List:  Current Outpatient Medications  Medication Sig Dispense Refill  . albuterol (PROVENTIL) (2.5 MG/3ML) 0.083% nebulizer solution Inhale into the lungs.    Marland Kitchen albuterol (VENTOLIN HFA) 108 (90 Base) MCG/ACT inhaler Inhale 1-2 puffs into the lungs every 4 (four) hours as needed for wheezing or shortness of breath. 1 each 2  . ALPRAZolam (XANAX) 0.25 MG tablet Take by mouth.    Marland Kitchen atorvastatin (LIPITOR) 40 MG tablet Take by mouth.    . Budeson-Glycopyrrol-Formoterol (BREZTRI AEROSPHERE) 160-9-4.8 MCG/ACT AERO Inhale 2 puffs into the lungs in the morning  and at bedtime.  with spacer and rinse mouth afterwards. 10.7 g 3  . budesonide-formoterol (SYMBICORT) 160-4.5 MCG/ACT inhaler 2 puffs    . cetirizine (ZYRTEC) 10 MG tablet Take 10 mg by mouth daily.    . citalopram (CELEXA) 20 MG tablet Take 20 mg by mouth daily.    Marland Kitchen EPINEPHrine 0.3 mg/0.3 mL IJ SOAJ injection Inject into the muscle.    Marland Kitchen FASENRA 30 MG/ML SOSY INJECT THE CONTENTS OF 1 SYRINGE (30 MG) UNDER THE SKIN EVERY 8 WEEKS 1 mL 6  . Fluticasone Propionate (XHANCE) 93 MCG/ACT EXHU 1-2 sprays per nostril twice a day. 32 mL 5  . Fluticasone Propionate, Inhal, 250 MCG/BLIST AEPB Inhale into the lungs.    Marland Kitchen levothyroxine (SYNTHROID, LEVOTHROID) 25 MCG tablet Take 1 tablet (25 mcg total) by mouth daily before breakfast. 30 tablet 3  . montelukast (SINGULAIR) 10 MG tablet 1 tablet    . omeprazole (PRILOSEC) 20 MG capsule 1 capsule 30 minutes before morning meal    . Semaglutide-Weight Management (WEGOVY) 0.5 MG/0.5ML SOAJ 0.5 mL    . tiotropium (SPIRIVA HANDIHALER) 18 MCG inhalation capsule 1 capsule by inhaling the contents of the capsule using the HandiHaler device (Patient not taking: Reported on 01/09/2022)    . TUBERCULIN SYR 1CC/27GX1/2" 27G X 1/2" 1 ML MISC 2 application by Misc.(Non-Drug; Combo Route) route once a week.    . Vitamin D, Ergocalciferol, (DRISDOL) 1.25 MG (50000 UNIT) CAPS capsule TAKE 1 CAPSULE BY MOUTH ONE TIME PER WEEK FOR 30 DAYS     Current Facility-Administered Medications  Medication Dose Route Frequency Provider Last Rate Last Admin  . Benralizumab SOSY 30 mg  30 mg Subcutaneous Q8 Thomes Dinning, MD   30 mg at 09/14/22 1548   Allergies: Allergies  Allergen Reactions  . Influenza Vaccines Hives, Itching, Rash and Swelling  . Other Swelling    FLU VACC.  Marland Kitchen Prednisone     Swelling,aching   I reviewed her past medical history, social history, family history, and environmental history and no significant changes have been reported from her previous visit.  Review  of Systems  Constitutional:  Positive for fatigue. Negative for appetite change, chills, fever and unexpected weight change.  HENT:  Positive for congestion. Negative for rhinorrhea.   Eyes:  Positive for itching.  Respiratory:  Negative for cough, chest tightness, shortness of breath and wheezing.   Gastrointestinal:  Negative for abdominal pain.  Skin:  Negative for rash.  Allergic/Immunologic: Positive for environmental allergies.  Neurological:  Positive for headaches.   Objective: There were no vitals taken for this visit. There is no height or weight on file to calculate BMI. Physical Exam Vitals and nursing note reviewed.  Constitutional:      Appearance: Normal appearance. She is well-developed.  HENT:     Head: Normocephalic and atraumatic.     Right Ear: Tympanic membrane and external ear normal.     Left Ear: Tympanic membrane and external ear normal.     Nose: Nose normal.     Mouth/Throat:     Mouth: Mucous membranes are moist.     Pharynx: Oropharynx is clear.  Eyes:     Conjunctiva/sclera: Conjunctivae normal.  Cardiovascular:     Rate and Rhythm: Normal rate and regular rhythm.     Heart sounds: Normal heart sounds. No murmur heard. Pulmonary:     Effort: Pulmonary effort is normal.     Breath sounds: Normal breath sounds. No wheezing, rhonchi or  rales.  Musculoskeletal:     Cervical back: Neck supple.  Skin:    General: Skin is warm.     Findings: No rash.  Neurological:     Mental Status: She is alert and oriented to person, place, and time.  Psychiatric:        Behavior: Behavior normal.  Previous notes and tests were reviewed. The plan was reviewed with the patient/family, and all questions/concerned were addressed.  It was my pleasure to see Stephanie Duncan today and participate in her care. Please feel free to contact me with any questions or concerns.  Sincerely,  Wyline Mood, DO Allergy & Immunology  [facility]  office: [phone] [facility] office: [phone]

## 2022-11-20 ENCOUNTER — Ambulatory Visit: Payer: 59 | Admitting: Allergy

## 2022-11-20 ENCOUNTER — Encounter: Payer: Self-pay | Admitting: Allergy

## 2022-11-20 ENCOUNTER — Ambulatory Visit: Payer: 59

## 2022-11-20 ENCOUNTER — Ambulatory Visit (INDEPENDENT_AMBULATORY_CARE_PROVIDER_SITE_OTHER): Payer: 59

## 2022-11-20 ENCOUNTER — Other Ambulatory Visit: Payer: Self-pay

## 2022-11-20 VITALS — BP 142/100 | HR 67 | Temp 98.4°F | Resp 16

## 2022-11-20 DIAGNOSIS — J4550 Severe persistent asthma, uncomplicated: Secondary | ICD-10-CM

## 2022-11-20 DIAGNOSIS — J310 Chronic rhinitis: Secondary | ICD-10-CM

## 2022-11-20 DIAGNOSIS — R030 Elevated blood-pressure reading, without diagnosis of hypertension: Secondary | ICD-10-CM

## 2022-11-20 NOTE — Assessment & Plan Note (Signed)
Past history - Flonase, and Singulair ineffective. Used to be on [place]. No prior ENT evaluation. 2021 bloodwork was negative to environmental panel. Interim history - Xhance not covered. Didn't see ENT as symptoms resolved.  Monitor symptoms.  May use saline nasal spray as needed.

## 2022-11-20 NOTE — Assessment & Plan Note (Signed)
Monitor and if still elevated please follow up with PCP regarding this.

## 2022-11-20 NOTE — Assessment & Plan Note (Signed)
Controlled with below regimen. Not sure what happened with Stephanie Duncan as taking generic Symbicort now. Today's spirometry was normal.  Daily controller medication(s):  Try to wean down Symbicort to 2 puffs once a day or 1 puff twice a day and rinse mouth after each use. If you have issues then resume 2 puffs twice a day. Continue Fasenra injections every 8 week - given today. Continue Singulair (montelukast) 10mg  daily at night. May use albuterol rescue inhaler 2 puffs or nebulizer every 4 to 6 hours as needed for shortness of breath, chest tightness, coughing, and wheezing. May use albuterol rescue inhaler 2 puffs 5 to 15 minutes prior to strenuous physical activities. Monitor frequency of use.  Get spirometry at next visit.

## 2022-11-20 NOTE — Patient Instructions (Addendum)
Sinus Monitor symptoms.  May use saline nasal spray as needed.   Asthma Daily controller medication(s):  Try to wean down Symbicort to 2 puffs once a day or 1 puff twice a day and rinse mouth after each use. If you have issues then resume 2 puffs twice a day. Continue Fasenra injections every 8 week - given today. Continue Singulair (montelukast) 10mg  daily at night. May use albuterol rescue inhaler 2 puffs or nebulizer every 4 to 6 hours as needed for shortness of breath, chest tightness, coughing, and wheezing. May use albuterol rescue inhaler 2 puffs 5 to 15 minutes prior to strenuous physical activities. Monitor frequency of use.  Asthma control goals:  Full participation in all desired activities (may need albuterol before activity) Albuterol use two times or less a week on average (not counting use with activity) Cough interfering with sleep two times or less a month Oral steroids no more than once a year No hospitalizations   Elevated blood pressure Monitor and if still elevated please follow up with PCP regarding this.   Follow up in 6 months or sooner if needed.

## 2023-01-15 ENCOUNTER — Ambulatory Visit (INDEPENDENT_AMBULATORY_CARE_PROVIDER_SITE_OTHER): Payer: 59 | Admitting: *Deleted

## 2023-01-15 ENCOUNTER — Ambulatory Visit: Payer: 59

## 2023-01-15 DIAGNOSIS — J4550 Severe persistent asthma, uncomplicated: Secondary | ICD-10-CM

## 2023-03-15 ENCOUNTER — Telehealth: Payer: Self-pay | Admitting: Allergy

## 2023-03-15 ENCOUNTER — Ambulatory Visit: Payer: 59

## 2023-03-15 DIAGNOSIS — J4550 Severe persistent asthma, uncomplicated: Secondary | ICD-10-CM

## 2023-03-15 NOTE — Telephone Encounter (Signed)
Patient came in today to get her injection and was notified of her 18 dollar bill. Patient states she is not supposed to have a bill or copay for her injection. Patient states the injections are supposed to be paid in full.

## 2023-04-30 ENCOUNTER — Other Ambulatory Visit: Payer: Self-pay | Admitting: Allergy

## 2023-05-10 ENCOUNTER — Ambulatory Visit: Payer: 59

## 2023-05-16 ENCOUNTER — Ambulatory Visit: Payer: 59

## 2023-05-16 DIAGNOSIS — J4550 Severe persistent asthma, uncomplicated: Secondary | ICD-10-CM | POA: Diagnosis not present

## 2023-06-19 ENCOUNTER — Telehealth: Payer: Self-pay | Admitting: *Deleted

## 2023-06-19 NOTE — Telephone Encounter (Signed)
Patient called Stephanie Duncan and advised approval needed for [facility]. I l/m for patient she needs MD appt for reapproval last OV in July

## 2023-07-03 ENCOUNTER — Ambulatory Visit: Payer: 59 | Admitting: Internal Medicine

## 2023-07-06 ENCOUNTER — Ambulatory Visit: Admitting: Internal Medicine

## 2023-07-10 NOTE — Progress Notes (Deleted)
   [address] [place] [address] Dept: [phone]  FOLLOW UP NOTE  Patient ID: Stephanie Duncan, female    DOB: January 28, 1961  Age: 63 y.o. MRN: [id number] Date of Office Visit: 07/12/2023  Assessment  Chief Complaint: No chief complaint on file.  HPI Stephanie Duncan is a 63 year old female who presents to the clinic for a follow up visit. She was last seen in this clinic on 11/20/2022 by Dr. Selena Batten for evaluation of asthma and chronic rhinitis. Her last environmental allergy testing on 03/04/2020 was negative to the environmental panel. In the interim, she was admitted to the hospital with influenza A, hypoxia, and right sided pneumonia on 06/07/2023.   Discussed the use of AI scribe software for clinical note transcription with the patient, who gave verbal consent to proceed.  History of Present Illness             Drug Allergies:  Allergies  Allergen Reactions   Influenza Vaccines Hives, Itching, Rash and Swelling   Cefdinir Other (See Comments)    Stomach aches, emesis and blood in stool   Other Swelling    FLU VACC.    Physical Exam: There were no vitals taken for this visit.   Physical Exam  Diagnostics:    Assessment and Plan: No diagnosis found.  No orders of the defined types were placed in this encounter.   There are no Patient Instructions on file for this visit.  No follow-ups on file.    Thank you for the opportunity to care for this patient.  Please do not hesitate to contact me with questions.  Thermon Leyland, FNP [facility]

## 2023-07-12 ENCOUNTER — Ambulatory Visit: Payer: 59

## 2023-07-12 ENCOUNTER — Ambulatory Visit: Admitting: Family Medicine

## 2023-07-12 DIAGNOSIS — J4550 Severe persistent asthma, uncomplicated: Secondary | ICD-10-CM | POA: Diagnosis not present

## 2023-07-24 NOTE — Progress Notes (Deleted)
 Follow Up Note  RE: Khadeja Abt MRN: [id number] DOB: Jan 19, 1961 Date of Office Visit: 07/25/2023  Referring provider: Joaquin Music, NP Primary care provider: Joaquin Music, NP  Chief Complaint: No chief complaint on file.  History of Present Illness: I had the pleasure of seeing [name] for a follow up visit at the [facility] on 07/24/2023. She is a 63 y.o. female, who is being followed for asthma on Fasenra, chronic rhinitis. Her previous allergy office visit was on 11/20/2022 with Dr. Selena Batten. Today is a regular follow up visit.  Discussed the use of AI scribe software for clinical note transcription with the patient, who gave verbal consent to proceed.  History of Present Illness            06/07/2023 admission: "Hospital Course:  For full details, please see H&P, progress notes, consult notes and ancillary notes. Briefly, AMEY HOSSAIN is a 63 y.o. female with severe persistent asthma, hypothyroidism, prior DVT not on AC presently, HLD, and chronic rhinitis . Hospital course will be addressed in a problem based format as below.  # Influenza A with superimposed bacterial pneumonia #Acute hypoxia Patient presented with shortness of breath and cough and found to be influenza A positive. She had an elevated Pro-Cal and leukocytosis. Chest x-ray was obtained with right base consolidation. In the ED she had acute hypoxia requiring 2 L nasal cannula for mild desaturations. She was given Decadron in ED for her history of severe persistent asthma but on examination she had no wheezes no increased work of breathing and had not even needed her rescue inhalers at home. There was low concern for asthma attack and she was not continued on steroids. She was treated with Tamiflu and IV Levaquin. She will complete a course of both on 2/10.  #Hypomagnesemia Patient was noted to have lab of 1.5 on admission, this was repleted  #HTN Patient is on lisinopril, amlodipine, clonidine at  home. She was normotensive here without medications. Lisinopril held on discharge until follow-up with PCP.  On day of discharge, patient is clinically stable with no new examination findings or acute symptoms compared to prior. The patient was seen by the attending physician on the date of discharge and deemed stable and acceptable for discharge. The patient's chronic medical conditions were treated accordingly per the patient's home medication regimen. The patient's medication reconciliation, follow-up appointments, discharge orders, instructions, and significant lab and diagnostic studies are as noted. "  Assessment and Plan: Devonia is a 63 y.o. female with: Severe persistent asthma without complication Controlled with below regimen. Not sure what happened with Markus Daft as taking generic Symbicort now. Today's spirometry was normal.  Daily controller medication(s):  Try to wean down Symbicort to 2 puffs once a day or 1 puff twice a day and rinse mouth after each use. If you have issues then resume 2 puffs twice a day. Continue Fasenra injections every 8 week - given today. Continue Singulair (montelukast) 10mg  daily at night. May use albuterol rescue inhaler 2 puffs or nebulizer every 4 to 6 hours as needed for shortness of breath, chest tightness, coughing, and wheezing. May use albuterol rescue inhaler 2 puffs 5 to 15 minutes prior to strenuous physical activities. Monitor frequency of use.  Get spirometry at next visit.   Chronic rhinitis Past history - Flonase, and Singulair ineffective. Used to be on [place]. No prior ENT evaluation. 2021 bloodwork was negative to environmental panel. Interim history - Xhance not covered.  Didn't see ENT as symptoms resolved.  Monitor symptoms.  May use saline nasal spray as needed.  Assessment and Plan              No follow-ups on file.  No orders of the defined types were placed in this encounter.  Lab Orders  No laboratory test(s)  ordered today    Diagnostics: Spirometry:  Tracings reviewed. Her effort: {Blank single:19197::"Good reproducible efforts.","It was hard to get consistent efforts and there is a question as to whether this reflects a maximal maneuver.","Poor effort, data can not be interpreted."} FVC: ***L FEV1: ***L, ***% predicted FEV1/FVC ratio: ***% Interpretation: {Blank single:19197::"Spirometry consistent with mild obstructive disease","Spirometry consistent with moderate obstructive disease","Spirometry consistent with severe obstructive disease","Spirometry consistent with possible restrictive disease","Spirometry consistent with mixed obstructive and restrictive disease","Spirometry uninterpretable due to technique","Spirometry consistent with normal pattern","No overt abnormalities noted given today's efforts"}.  Please see scanned spirometry results for details.  Skin Testing: {Blank single:19197::"Select foods","Environmental allergy panel","Environmental allergy panel and select foods","Food allergy panel","None","Deferred due to recent antihistamines use"}. *** Results discussed with patient/family.   Medication List:  Current Outpatient Medications  Medication Sig Dispense Refill   albuterol (PROVENTIL) (2.5 MG/3ML) 0.083% nebulizer solution Inhale into the lungs.     albuterol (VENTOLIN HFA) 108 (90 Base) MCG/ACT inhaler Inhale 1-2 puffs into the lungs every 4 (four) hours as needed for wheezing or shortness of breath. 1 each 2   ALPRAZolam (XANAX) 0.25 MG tablet Take by mouth.     atorvastatin (LIPITOR) 40 MG tablet Take by mouth.     budesonide-formoterol (SYMBICORT) 160-4.5 MCG/ACT inhaler 2 puffs     cetirizine (ZYRTEC) 10 MG tablet Take 10 mg by mouth daily.     Desvenlafaxine ER 100 MG TB24 1 tablet Orally Once a day for 90 days     EPINEPHrine 0.3 mg/0.3 mL IJ SOAJ injection Inject into the muscle.     FASENRA 30 MG/ML prefilled syringe INJECT THE CONTENTS OF 1 SYRINGE (30 MG)  UNDER THE SKIN EVERY 8 WEEKS 1 mL 6   levothyroxine (SYNTHROID, LEVOTHROID) 25 MCG tablet Take 1 tablet (25 mcg total) by mouth daily before breakfast. 30 tablet 3   montelukast (SINGULAIR) 10 MG tablet 1 tablet     omeprazole (PRILOSEC) 20 MG capsule 1 capsule 30 minutes before morning meal     TUBERCULIN SYR 1CC/27GX1/2" 27G X 1/2" 1 ML MISC 2 application by Misc.(Non-Drug; Combo Route) route once a week.     Vitamin D, Ergocalciferol, (DRISDOL) 1.25 MG (50000 UNIT) CAPS capsule TAKE 1 CAPSULE BY MOUTH ONE TIME PER WEEK FOR 30 DAYS     Current Facility-Administered Medications  Medication Dose Route Frequency Provider Last Rate Last Admin   Benralizumab SOSY 30 mg  30 mg Subcutaneous Q8 Thomes Dinning, MD   30 mg at 07/12/23 1606   Allergies: Allergies  Allergen Reactions   Influenza Vaccines Hives, Itching, Rash and Swelling   Cefdinir Other (See Comments)    Stomach aches, emesis and blood in stool   Other Swelling    FLU VACC.   I reviewed her past medical history, social history, family history, and environmental history and no significant changes have been reported from her previous visit.  Review of Systems  Constitutional:  Negative for appetite change, chills, fever and unexpected weight change.  HENT:  Negative for congestion and rhinorrhea.   Eyes:  Negative for itching.  Respiratory:  Negative for cough, chest tightness, shortness of breath and wheezing.  Gastrointestinal:  Negative for abdominal pain.  Skin:  Negative for rash.  Allergic/Immunologic: Positive for environmental allergies.  Neurological:  Positive for headaches.    Objective: There were no vitals taken for this visit. There is no height or weight on file to calculate BMI. Physical Exam Vitals and nursing note reviewed.  Constitutional:      Appearance: Normal appearance. She is well-developed.  HENT:     Head: Normocephalic and atraumatic.     Right Ear: Tympanic membrane and  external ear normal.     Left Ear: Tympanic membrane and external ear normal.     Nose: Nose normal.     Mouth/Throat:     Mouth: Mucous membranes are moist.     Pharynx: Oropharynx is clear.  Eyes:     Conjunctiva/sclera: Conjunctivae normal.  Cardiovascular:     Rate and Rhythm: Normal rate and regular rhythm.     Heart sounds: Normal heart sounds. No murmur heard. Pulmonary:     Effort: Pulmonary effort is normal.     Breath sounds: Normal breath sounds. No wheezing, rhonchi or rales.  Musculoskeletal:     Cervical back: Neck supple.  Skin:    General: Skin is warm.     Findings: No rash.  Neurological:     Mental Status: She is alert and oriented to person, place, and time.  Psychiatric:        Behavior: Behavior normal.    Previous notes and tests were reviewed. The plan was reviewed with the patient/family, and all questions/concerned were addressed.  It was my pleasure to see Lai today and participate in her care. Please feel free to contact me with any questions or concerns.  Sincerely,  Wyline Mood, DO Allergy & Immunology  [facility] office: [phone] [facility] office: [phone]

## 2023-07-25 ENCOUNTER — Ambulatory Visit: Payer: Self-pay | Admitting: Allergy

## 2023-08-05 NOTE — Progress Notes (Unsigned)
 Follow Up Note  RE: Stephanie Duncan MRN: [id number] DOB: Nov 24, 1960 Date of Office Visit: 08/06/2023  Referring provider: Joaquin Music, NP Primary care provider: Joaquin Music, NP  Chief Complaint: No chief complaint on file.  History of Present Illness: I had the pleasure of seeing Stephanie Duncan for a follow up visit at the [facility] on 08/05/2023. She is a 63 y.o. female, who is being followed for asthma on Fasenra, chronic rhinitis. Her previous allergy office visit was on 11/20/2022 with Dr. Selena Batten. Today is a regular follow up visit.  Discussed the use of AI scribe software for clinical note transcription with the patient, who gave verbal consent to proceed.  History of Present Illness            06/07/2023 admission: "Hospital Course:  For full details, please see H&P, progress notes, consult notes and ancillary notes. Briefly, Stephanie Duncan is a 63 y.o. female with severe persistent asthma, hypothyroidism, prior DVT not on AC presently, HLD, and chronic rhinitis . Hospital course will be addressed in a problem based format as below.  # Influenza A with superimposed bacterial pneumonia #Acute hypoxia Patient presented with shortness of breath and cough and found to be influenza A positive. She had an elevated Pro-Cal and leukocytosis. Chest x-ray was obtained with right base consolidation. In the ED she had acute hypoxia requiring 2 L nasal cannula for mild desaturations. She was given Decadron in ED for her history of severe persistent asthma but on examination she had no wheezes no increased work of breathing and had not even needed her rescue inhalers at home. There was low concern for asthma attack and she was not continued on steroids. She was treated with Tamiflu and IV Levaquin. She will complete a course of both on 2/10.  #Hypomagnesemia Patient was noted to have lab of 1.5 on admission, this was repleted  #HTN Patient is on lisinopril, amlodipine, clonidine at  home. She was normotensive here without medications. Lisinopril held on discharge until follow-up with PCP.  On day of discharge, patient is clinically stable with no new examination findings or acute symptoms compared to prior. The patient was seen by the attending physician on the date of discharge and deemed stable and acceptable for discharge. The patient's chronic medical conditions were treated accordingly per the patient's home medication regimen. The patient's medication reconciliation, follow-up appointments, discharge orders, instructions, and significant lab and diagnostic studies are as noted. "  Assessment and Plan: Stephanie Duncan is a 63 y.o. female with: Severe persistent asthma without complication Controlled with below regimen. Not sure what happened with Stephanie Duncan as taking generic Symbicort now. Today's spirometry was normal.  Daily controller medication(s):  Try to wean down Symbicort to 2 puffs once a day or 1 puff twice a day and rinse mouth after each use. If you have issues then resume 2 puffs twice a day. Continue Fasenra injections every 8 week - given today. Continue Singulair (montelukast) 10mg  daily at night. May use albuterol rescue inhaler 2 puffs or nebulizer every 4 to 6 hours as needed for shortness of breath, chest tightness, coughing, and wheezing. May use albuterol rescue inhaler 2 puffs 5 to 15 minutes prior to strenuous physical activities. Monitor frequency of use.  Get spirometry at next visit.   Chronic rhinitis Past history - Flonase, and Singulair ineffective. Used to be on [place]. No prior ENT evaluation. 2021 bloodwork was negative to environmental panel. Interim history - Stephanie Duncan not covered.  Didn't see ENT as symptoms resolved.  Monitor symptoms.  May use saline nasal spray as needed.  Assessment and Plan              No follow-ups on file.  No orders of the defined types were placed in this encounter.  Lab Orders  No laboratory test(s)  ordered today    Diagnostics: Spirometry:  Tracings reviewed. Her effort: {Blank single:19197::"Good reproducible efforts.","It was hard to get consistent efforts and there is a question as to whether this reflects a maximal maneuver.","Poor effort, data can not be interpreted."} FVC: ***L FEV1: ***L, ***% predicted FEV1/FVC ratio: ***% Interpretation: {Blank single:19197::"Spirometry consistent with mild obstructive disease","Spirometry consistent with moderate obstructive disease","Spirometry consistent with severe obstructive disease","Spirometry consistent with possible restrictive disease","Spirometry consistent with mixed obstructive and restrictive disease","Spirometry uninterpretable due to technique","Spirometry consistent with normal pattern","No overt abnormalities noted given today's efforts"}.  Please see scanned spirometry results for details.  Skin Testing: {Blank single:19197::"Select foods","Environmental allergy panel","Environmental allergy panel and select foods","Food allergy panel","None","Deferred due to recent antihistamines use"}. *** Results discussed with patient/family.   Medication List:  Current Outpatient Medications  Medication Sig Dispense Refill   albuterol (PROVENTIL) (2.5 MG/3ML) 0.083% nebulizer solution Inhale into the lungs.     albuterol (VENTOLIN HFA) 108 (90 Base) MCG/ACT inhaler Inhale 1-2 puffs into the lungs every 4 (four) hours as needed for wheezing or shortness of breath. 1 each 2   ALPRAZolam (XANAX) 0.25 MG tablet Take by mouth.     atorvastatin (LIPITOR) 40 MG tablet Take by mouth.     budesonide-formoterol (SYMBICORT) 160-4.5 MCG/ACT inhaler 2 puffs     cetirizine (ZYRTEC) 10 MG tablet Take 10 mg by mouth daily.     Desvenlafaxine ER 100 MG TB24 1 tablet Orally Once a day for 90 days     EPINEPHrine 0.3 mg/0.3 mL IJ SOAJ injection Inject into the muscle.     FASENRA 30 MG/ML prefilled syringe INJECT THE CONTENTS OF 1 SYRINGE (30 MG)  UNDER THE SKIN EVERY 8 WEEKS 1 mL 6   levothyroxine (SYNTHROID, LEVOTHROID) 25 MCG tablet Take 1 tablet (25 mcg total) by mouth daily before breakfast. 30 tablet 3   montelukast (SINGULAIR) 10 MG tablet 1 tablet     omeprazole (PRILOSEC) 20 MG capsule 1 capsule 30 minutes before morning meal     TUBERCULIN SYR 1CC/27GX1/2" 27G X 1/2" 1 ML MISC 2 application by Misc.(Non-Drug; Combo Route) route once a week.     Vitamin D, Ergocalciferol, (DRISDOL) 1.25 MG (50000 UNIT) CAPS capsule TAKE 1 CAPSULE BY MOUTH ONE TIME PER WEEK FOR 30 DAYS     Current Facility-Administered Medications  Medication Dose Route Frequency Provider Last Rate Last Admin   Benralizumab SOSY 30 mg  30 mg Subcutaneous Q8 Thomes Dinning, MD   30 mg at 07/12/23 1606   Allergies: Allergies  Allergen Reactions   Influenza Vaccines Hives, Itching, Rash and Swelling   Cefdinir Other (See Comments)    Stomach aches, emesis and blood in stool   Other Swelling    FLU VACC.   I reviewed her past medical history, social history, family history, and environmental history and no significant changes have been reported from her previous visit.  Review of Systems  Constitutional:  Negative for appetite change, chills, fever and unexpected weight change.  HENT:  Negative for congestion and rhinorrhea.   Eyes:  Negative for itching.  Respiratory:  Negative for cough, chest tightness, shortness of breath and wheezing.  Gastrointestinal:  Negative for abdominal pain.  Skin:  Negative for rash.  Allergic/Immunologic: Positive for environmental allergies.  Neurological:  Positive for headaches.    Objective: There were no vitals taken for this visit. There is no height or weight on file to calculate BMI. Physical Exam Vitals and nursing note reviewed.  Constitutional:      Appearance: Normal appearance. She is well-developed.  HENT:     Head: Normocephalic and atraumatic.     Right Ear: Tympanic membrane and  external ear normal.     Left Ear: Tympanic membrane and external ear normal.     Nose: Nose normal.     Mouth/Throat:     Mouth: Mucous membranes are moist.     Pharynx: Oropharynx is clear.  Eyes:     Conjunctiva/sclera: Conjunctivae normal.  Cardiovascular:     Rate and Rhythm: Normal rate and regular rhythm.     Heart sounds: Normal heart sounds. No murmur heard. Pulmonary:     Effort: Pulmonary effort is normal.     Breath sounds: Normal breath sounds. No wheezing, rhonchi or rales.  Musculoskeletal:     Cervical back: Neck supple.  Skin:    General: Skin is warm.     Findings: No rash.  Neurological:     Mental Status: She is alert and oriented to person, place, and time.  Psychiatric:        Behavior: Behavior normal.    Previous notes and tests were reviewed. The plan was reviewed with the patient/family, and all questions/concerned were addressed.  It was my pleasure to see Stephanie Duncan today and participate in her care. Please feel free to contact me with any questions or concerns.  Sincerely,  Wyline Mood, DO Allergy & Immunology  [facility] office: [phone] [facility] office: [phone]

## 2023-08-06 ENCOUNTER — Encounter: Payer: Self-pay | Admitting: Allergy

## 2023-08-06 ENCOUNTER — Ambulatory Visit (INDEPENDENT_AMBULATORY_CARE_PROVIDER_SITE_OTHER): Payer: Self-pay | Admitting: Allergy

## 2023-08-06 ENCOUNTER — Other Ambulatory Visit: Payer: Self-pay

## 2023-08-06 VITALS — BP 140/96 | HR 78 | Temp 97.9°F | Resp 16

## 2023-08-06 DIAGNOSIS — Z887 Allergy status to serum and vaccine status: Secondary | ICD-10-CM

## 2023-08-06 DIAGNOSIS — B999 Unspecified infectious disease: Secondary | ICD-10-CM | POA: Diagnosis not present

## 2023-08-06 DIAGNOSIS — R030 Elevated blood-pressure reading, without diagnosis of hypertension: Secondary | ICD-10-CM

## 2023-08-06 DIAGNOSIS — T50B95D Adverse effect of other viral vaccines, subsequent encounter: Secondary | ICD-10-CM

## 2023-08-06 DIAGNOSIS — J4550 Severe persistent asthma, uncomplicated: Secondary | ICD-10-CM

## 2023-08-06 DIAGNOSIS — J310 Chronic rhinitis: Secondary | ICD-10-CM

## 2023-08-06 NOTE — Addendum Note (Signed)
 Addended by: Orson Aloe on: 08/06/2023 05:11 PM   Modules accepted: Orders

## 2023-08-06 NOTE — Patient Instructions (Addendum)
 Asthma Today's breathing test was normal.  Daily controller medication(s):  Symbicort 1 puff once a day and rinse mouth after each use.  Continue Fasenra injections every 8 week.  Continue Singulair (montelukast) 10mg  daily at night. During respiratory infections/flares:  Start Symbicort 2 puffs twice a day with spacer and rinse mouth afterwards for 1-2 weeks until your breathing symptoms return to baseline.  Pretreat with albuterol 2 puffs or albuterol nebulizer.  If you need to use your albuterol nebulizer machine back to back within 15-30 minutes with no relief then please go to the ER/urgent care for further evaluation.  May use albuterol rescue inhaler 2 puffs or nebulizer every 4 to 6 hours as needed for shortness of breath, chest tightness, coughing, and wheezing. May use albuterol rescue inhaler 2 puffs 5 to 15 minutes prior to strenuous physical activities. Monitor frequency of use - if you need to use it more than twice per week on a consistent basis let us know.  Breathing control goals:  Full participation in all desired activities (may need albuterol before activity) Albuterol use two times or less a week on average (not counting use with activity) Cough interfering with sleep two times or less a month Oral steroids no more than once a year No hospitalizations   Infections Keep track of infections and antibiotics use. If persistent will get bloodwork next to look at immune system.   Flu vaccine Consider flu vaccine skin testing and vaccine challenge in the fall.   Elevated blood pressure  Blood pressure reading was high in our office today. Vitals:   08/06/23 1523  BP: (!) 140/96  Please follow up with PCP regarding this.    Follow up in 6 months or sooner if needed.

## 2023-09-06 ENCOUNTER — Ambulatory Visit: Payer: Self-pay

## 2023-09-06 DIAGNOSIS — J4550 Severe persistent asthma, uncomplicated: Secondary | ICD-10-CM

## 2023-09-26 ENCOUNTER — Ambulatory Visit (INDEPENDENT_AMBULATORY_CARE_PROVIDER_SITE_OTHER): Admitting: Obstetrics and Gynecology

## 2023-09-26 ENCOUNTER — Other Ambulatory Visit (HOSPITAL_COMMUNITY)
Admission: RE | Admit: 2023-09-26 | Discharge: 2023-09-26 | Disposition: A | Discharge status: Home / Self Care | Payer: Self-pay | Source: Ambulatory Visit | Attending: Obstetrics and Gynecology | Admitting: Obstetrics and Gynecology

## 2023-09-26 ENCOUNTER — Encounter: Payer: Self-pay | Admitting: Obstetrics and Gynecology

## 2023-09-26 VITALS — BP 120/84 | HR 71 | Ht 65.25 in | Wt 168.0 lb

## 2023-09-26 DIAGNOSIS — Z1231 Encounter for screening mammogram for malignant neoplasm of breast: Secondary | ICD-10-CM

## 2023-09-26 DIAGNOSIS — Z1331 Encounter for screening for depression: Secondary | ICD-10-CM

## 2023-09-26 DIAGNOSIS — E2839 Other primary ovarian failure: Secondary | ICD-10-CM | POA: Diagnosis not present

## 2023-09-26 DIAGNOSIS — Z01419 Encounter for gynecological examination (general) (routine) without abnormal findings: Secondary | ICD-10-CM | POA: Diagnosis not present

## 2023-09-26 DIAGNOSIS — Z1211 Encounter for screening for malignant neoplasm of colon: Secondary | ICD-10-CM

## 2023-09-26 NOTE — Progress Notes (Signed)
 63 y.o. y.o. female here for New annual exam. No LMP recorded. Patient is postmenopausal.    New/established,aex//jj PAP: 12-23-12, MMG: 09-14-22  Denies any abnormal pap smears or PMB No HRT Denies andy pelvic discharge or pelvic pain: or GU complaints  Last mammogram: reports she has scheduled today Last colonoscopy: 6 years ago Dxa: not done. Referral placed Has dermatologist for annual mole checks Labs with PCP this year  There is no height or weight on file to calculate BMI.      No data to display          There were no vitals taken for this visit.  No results found for: "DIAGPAP", "HPVHIGH", "ADEQPAP"  GYN HISTORY: No results found for: "DIAGPAP", "HPVHIGH", "ADEQPAP"  OB History  Gravida Para Term Preterm AB Living  3 2 2  1 2   SAB IAB Ectopic Multiple Live Births  1        # Outcome Date GA Lbr Len/2nd Weight Sex Type Anes PTL Lv  3 SAB           2 Term           1 Term             Past Medical History:  Diagnosis Date   Asthma    Hypothyroid 11/2012    No past surgical history on file.  Current Outpatient Medications on File Prior to Visit  Medication Sig Dispense Refill   albuterol  (PROVENTIL ) (2.5 MG/3ML) 0.083% nebulizer solution Inhale into the lungs.     albuterol  (VENTOLIN  HFA) 108 (90 Base) MCG/ACT inhaler Inhale 1-2 puffs into the lungs every 4 (four) hours as needed for wheezing or shortness of breath. 1 each 2   ALPRAZolam (XANAX) 0.25 MG tablet Take by mouth.     atorvastatin (LIPITOR) 40 MG tablet Take by mouth.     budesonide -formoterol  (SYMBICORT ) 160-4.5 MCG/ACT inhaler 2 puffs     cetirizine (ZYRTEC) 10 MG tablet Take 10 mg by mouth daily.     Desvenlafaxine ER 100 MG TB24 1 tablet Orally Once a day for 90 days     EPINEPHrine  0.3 mg/0.3 mL IJ SOAJ injection Inject into the muscle.     FASENRA  30 MG/ML prefilled syringe INJECT THE CONTENTS OF 1 SYRINGE (30 MG) UNDER THE SKIN EVERY 8 WEEKS 1 mL 6   levothyroxine  (SYNTHROID ,  LEVOTHROID) 25 MCG tablet Take 1 tablet (25 mcg total) by mouth daily before breakfast. 30 tablet 3   montelukast  (SINGULAIR ) 10 MG tablet 1 tablet     omeprazole  (PRILOSEC) 20 MG capsule 1 capsule 30 minutes before morning meal     TUBERCULIN SYR 1CC/27GX1/2" 27G X 1/2" 1 ML MISC 2 application by Misc.(Non-Drug; Combo Route) route once a week.     Vitamin D , Ergocalciferol , (DRISDOL) 1.25 MG (50000 UNIT) CAPS capsule TAKE 1 CAPSULE BY MOUTH ONE TIME PER WEEK FOR 30 DAYS     Current Facility-Administered Medications on File Prior to Visit  Medication Dose Route Frequency Provider Last Rate Last Admin   Benralizumab  SOSY 30 mg  30 mg Subcutaneous Q8 Weeks Gallagher, Joel Louis, MD   30 mg at 09/06/23 1603    Social History   Socioeconomic History   Marital status: Married    Spouse name: Not on file   Number of children: Not on file   Years of education: Not on file   Highest education level: Not on file  Occupational History   Not on file  Tobacco Use   Smoking status: Former   Smokeless tobacco: Never  Vaping Use   Vaping status: Never Used  Substance and Sexual Activity   Alcohol use: Yes    Comment: Rare   Drug use: No   Sexual activity: Yes  Other Topics Concern   Not on file  Social History Narrative   Not on file   Social Drivers of Health   Financial Resource Strain: Not on file  Food Insecurity: Low Risk  (06/07/2023)   Received from [facility]   Hunger Vital Sign    Worried About Running Out of Food in the Last Year: Never true    Ran Out of Food in the Last Year: Never true  Transportation Needs: No Transportation Needs (06/07/2023)   Received from [facility]    In the past 12 months, has lack of reliable transportation kept you from medical appointments, meetings, work or from getting things needed for daily living? : No  Physical Activity: Not on file  Stress: Not on file  Social Connections: Not on file  Intimate Partner Violence: Not  on file    Family History  Problem Relation Age of Onset   Hypertension Mother    Stroke Mother      Allergies  Allergen Reactions   Influenza Vaccines Hives, Itching, Rash and Swelling   Cefdinir Other (See Comments)    Stomach aches, emesis and blood in stool   Other Swelling    FLU VACC.      Patient's last menstrual period was No LMP recorded. Patient is postmenopausal..           Review of Systems Alls systems reviewed and are negative.     Physical Exam Constitutional:      Appearance: Normal appearance.  Genitourinary:     Vulva and urethral meatus normal.     No lesions in the vagina.     Right Labia: No rash, lesions or skin changes.    Left Labia: No lesions, skin changes or rash.    No vaginal discharge or tenderness.     No vaginal prolapse present.    Mild vaginal atrophy present.     Right Adnexa: not tender, not palpable and no mass present.    Left Adnexa: not tender, not palpable and no mass present.    No cervical motion tenderness or discharge.     Uterus is not enlarged, tender or irregular.  Breasts:    Right: Normal.     Left: Normal.  HENT:     Head: Normocephalic.  Neck:     Thyroid : No thyroid  mass, thyromegaly or thyroid  tenderness.  Cardiovascular:     Rate and Rhythm: Normal rate and regular rhythm.     Heart sounds: Normal heart sounds, S1 normal and S2 normal.  Pulmonary:     Effort: Pulmonary effort is normal.     Breath sounds: Normal breath sounds and air entry.  Abdominal:     General: There is no distension.     Palpations: Abdomen is soft. There is no mass.     Tenderness: There is no abdominal tenderness. There is no guarding or rebound.  Musculoskeletal:        General: Normal range of motion.     Cervical back: Full passive range of motion without pain, normal range of motion and neck supple. No tenderness.     Right lower leg: No edema.     Left lower leg: No edema.  Neurological:     Mental Status: She is  alert.  Skin:    General: Skin is warm.  Psychiatric:        Mood and Affect: Mood normal.        Behavior: Behavior normal.        Thought Content: Thought content normal.  Vitals and nursing note reviewed. Exam conducted with a chaperone present.       A:         Well Woman GYN exam                             P:        Pap smear collected today Encouraged annual mammogram screening Colon cancer screening referral placed today DXA ordered today Labs and immunizations to do with PMD Discussed breast self exams Encouraged healthy lifestyle practices Encouraged Vit D and Calcium   No follow-ups on file.  Reinaldo Caras

## 2023-10-03 ENCOUNTER — Ambulatory Visit: Payer: Self-pay | Admitting: Obstetrics and Gynecology

## 2023-10-03 LAB — CYTOLOGY - PAP
Diagnosis: NEGATIVE
Diagnosis: REACTIVE

## 2023-11-01 ENCOUNTER — Ambulatory Visit

## 2023-11-01 DIAGNOSIS — J4550 Severe persistent asthma, uncomplicated: Secondary | ICD-10-CM | POA: Diagnosis not present

## 2023-12-27 ENCOUNTER — Ambulatory Visit (INDEPENDENT_AMBULATORY_CARE_PROVIDER_SITE_OTHER): Payer: Self-pay

## 2023-12-27 DIAGNOSIS — J4550 Severe persistent asthma, uncomplicated: Secondary | ICD-10-CM | POA: Diagnosis not present

## 2024-02-03 NOTE — Progress Notes (Deleted)
 Follow Up Note  RE: Stephanie Duncan MRN: [id number] DOB: 05/10/1960 Date of Office Visit: 02/04/2024  Referring provider: Dorene Perkins, NP Primary care provider: Dorene Perkins, NP  Chief Complaint: No chief complaint on file.  History of Present Illness: I had the pleasure of seeing Stephanie Duncan for a follow up visit at the [facility] on 02/04/2024. She is a 64 y.o. female, who is being followed for asthma on Fasenra , recurrent infections. Her previous allergy office visit was on 08/06/2023 with Dr. Luke. Today is a regular follow up visit.  Discussed the use of AI scribe software for clinical note transcription with the patient, who gave verbal consent to proceed.  History of Present Illness            ***  Assessment and Plan: Stephanie Duncan is a 63 y.o. female with: Severe persistent asthma without complication Hospitalized for flu and pneumonia but asthma didn't flare. Today's spirometry was normal.  Daily controller medication(s):  Symbicort  160mcg 1 puff once a day and rinse mouth after each use.  Continue Fasenra  injections every 8 week.  Continue Singulair  (montelukast ) 10mg  daily at night. During respiratory infections/flares:  Start Symbicort  160mcg 2 puffs twice a day with spacer and rinse mouth afterwards for 1-2 weeks until your breathing symptoms return to baseline.  Pretreat with albuterol  2 puffs or albuterol  nebulizer.  If you need to use your albuterol  nebulizer machine back to back within 15-30 minutes with no relief then please go to the ER/urgent care for further evaluation.  May use albuterol  rescue inhaler 2 puffs or nebulizer every 4 to 6 hours as needed for shortness of breath, chest tightness, coughing, and wheezing. May use albuterol  rescue inhaler 2 puffs 5 to 15 minutes prior to strenuous physical activities. Monitor frequency of use - if you need to use it more than twice per week on a consistent basis let us  know.  Get spirometry at next  visit.   Recurrent infections Keep track of infections and antibiotics use. If persistent will get bloodwork next to look at immune system.    Adverse reaction to viral vaccines, subsequent encounter Discussed influenza vaccination options due to past allergic reaction. Vaccination may prevent severe illness and hospitalization. Consider flu vaccine skin testing and vaccine challenge in the fall. Assessment and Plan              No follow-ups on file.  No orders of the defined types were placed in this encounter.  Lab Orders  No laboratory test(s) ordered today    Diagnostics: Spirometry:  Tracings reviewed. Her effort: {Blank single:19197::Good reproducible efforts.,It was hard to get consistent efforts and there is a question as to whether this reflects a maximal maneuver.,Poor effort, data can not be interpreted.} FVC: ***L FEV1: ***L, ***% predicted FEV1/FVC ratio: ***% Interpretation: {Blank single:19197::Spirometry consistent with mild obstructive disease,Spirometry consistent with moderate obstructive disease,Spirometry consistent with severe obstructive disease,Spirometry consistent with possible restrictive disease,Spirometry consistent with mixed obstructive and restrictive disease,Spirometry uninterpretable due to technique,Spirometry consistent with normal pattern,No overt abnormalities noted given today's efforts}.  Please see scanned spirometry results for details.  Skin Testing: {Blank single:19197::Select foods,Environmental allergy panel,Environmental allergy panel and select foods,Food allergy panel,None,Deferred due to recent antihistamines use}. *** Results discussed with patient/family.   Medication List:  Current Outpatient Medications  Medication Sig Dispense Refill   albuterol  (PROVENTIL ) (2.5 MG/3ML) 0.083% nebulizer solution Inhale into the lungs.     albuterol  (VENTOLIN  HFA) 108 (90 Base) MCG/ACT inhaler  Inhale  1-2 puffs into the lungs every 4 (four) hours as needed for wheezing or shortness of breath. 1 each 2   ALPRAZolam (XANAX) 0.25 MG tablet Take by mouth.     amLODipine (NORVASC) 10 MG tablet Take 10 mg by mouth daily.     atorvastatin (LIPITOR) 40 MG tablet Take by mouth.     budesonide -formoterol  (SYMBICORT ) 160-4.5 MCG/ACT inhaler 2 puffs     cetirizine (ZYRTEC) 10 MG tablet Take 10 mg by mouth daily.     cloNIDine (CATAPRES) 0.1 MG tablet Take 0.1 mg by mouth daily.     desvenlafaxine (PRISTIQ) 100 MG 24 hr tablet Take 100 mg by mouth daily.     EPINEPHrine  0.3 mg/0.3 mL IJ SOAJ injection Inject into the muscle. (Patient not taking: Reported on 09/26/2023)     FASENRA  30 MG/ML prefilled syringe INJECT THE CONTENTS OF 1 SYRINGE (30 MG) UNDER THE SKIN EVERY 8 WEEKS 1 mL 6   levothyroxine  (SYNTHROID , LEVOTHROID) 25 MCG tablet Take 1 tablet (25 mcg total) by mouth daily before breakfast. 30 tablet 3   lisinopril (ZESTRIL) 40 MG tablet Take 40 mg by mouth daily.     loratadine (CLARITIN) 10 MG tablet 1 tablet Orally Once a day     montelukast  (SINGULAIR ) 10 MG tablet 1 tablet     omeprazole  (PRILOSEC) 20 MG capsule 1 capsule 30 minutes before morning meal     Vitamin D , Ergocalciferol , (DRISDOL) 1.25 MG (50000 UNIT) CAPS capsule TAKE 1 CAPSULE BY MOUTH ONE TIME PER WEEK FOR 30 DAYS     Current Facility-Administered Medications  Medication Dose Route Frequency Provider Last Rate Last Admin   Benralizumab  SOSY 30 mg  30 mg Subcutaneous Q8 Weeks Gallagher, Joel Louis, MD   30 mg at 12/27/23 1015   Allergies: Allergies  Allergen Reactions   Influenza Vaccines Hives, Itching, Rash and Swelling   Cefdinir Other (See Comments)    Stomach aches, emesis and blood in stool   I reviewed her past medical history, social history, family history, and environmental history and no significant changes have been reported from her previous visit.  Review of Systems  Constitutional:  Negative for appetite  change, chills, fever and unexpected weight change.  HENT:  Negative for congestion and rhinorrhea.   Eyes:  Negative for itching.  Respiratory:  Negative for cough, chest tightness, shortness of breath and wheezing.   Gastrointestinal:  Negative for abdominal pain.  Skin:  Negative for rash.  Allergic/Immunologic: Positive for environmental allergies.  Neurological:  Positive for headaches.    Objective: There were no vitals taken for this visit. There is no height or weight on file to calculate BMI. Physical Exam Vitals and nursing note reviewed.  Constitutional:      Appearance: Normal appearance. She is well-developed.  HENT:     Head: Normocephalic and atraumatic.     Right Ear: Tympanic membrane and external ear normal.     Left Ear: Tympanic membrane and external ear normal.     Nose: Nose normal.     Mouth/Throat:     Mouth: Mucous membranes are moist.     Pharynx: Oropharynx is clear.  Eyes:     Conjunctiva/sclera: Conjunctivae normal.  Cardiovascular:     Rate and Rhythm: Normal rate and regular rhythm.     Heart sounds: Normal heart sounds. No murmur heard. Pulmonary:     Effort: Pulmonary effort is normal.     Breath sounds: Normal breath sounds. No wheezing, rhonchi  or rales.  Musculoskeletal:     Cervical back: Neck supple.  Skin:    General: Skin is warm.     Findings: No rash.  Neurological:     Mental Status: She is alert and oriented to person, place, and time.  Psychiatric:        Behavior: Behavior normal.    Previous notes and tests were reviewed. The plan was reviewed with the patient/family, and all questions/concerned were addressed.  It was my pleasure to see Stephanie Duncan today and participate in her care. Please feel free to contact me with any questions or concerns.  Sincerely,  Orlan Cramp, DO Allergy & Immunology  [facility]  [place] office: [phone] [facility] office: [phone]

## 2024-02-04 ENCOUNTER — Ambulatory Visit: Payer: Self-pay | Admitting: Allergy

## 2024-02-04 DIAGNOSIS — J4550 Severe persistent asthma, uncomplicated: Secondary | ICD-10-CM

## 2024-02-04 DIAGNOSIS — B999 Unspecified infectious disease: Secondary | ICD-10-CM

## 2024-02-22 ENCOUNTER — Ambulatory Visit

## 2024-02-22 ENCOUNTER — Ambulatory Visit: Payer: Self-pay | Admitting: Family Medicine

## 2024-02-28 ENCOUNTER — Ambulatory Visit (INDEPENDENT_AMBULATORY_CARE_PROVIDER_SITE_OTHER): Payer: Self-pay | Admitting: Family Medicine

## 2024-02-28 ENCOUNTER — Other Ambulatory Visit: Payer: Self-pay

## 2024-02-28 ENCOUNTER — Ambulatory Visit

## 2024-02-28 VITALS — BP 108/72 | HR 73 | Temp 97.7°F | Ht 65.25 in | Wt 167.6 lb

## 2024-02-28 DIAGNOSIS — B999 Unspecified infectious disease: Secondary | ICD-10-CM

## 2024-02-28 DIAGNOSIS — T50B95D Adverse effect of other viral vaccines, subsequent encounter: Secondary | ICD-10-CM

## 2024-02-28 DIAGNOSIS — Z887 Allergy status to serum and vaccine status: Secondary | ICD-10-CM | POA: Diagnosis not present

## 2024-02-28 DIAGNOSIS — J4550 Severe persistent asthma, uncomplicated: Secondary | ICD-10-CM | POA: Diagnosis not present

## 2024-02-28 NOTE — Patient Instructions (Signed)
 Asthma Continue Symbicort  1 puff once a day with a spacer to prevent cough or wheeze Continue montelukast  10 mg once a day Continue Fasenra  injections once every 8 weeks For asthma flare increase Symbicort  to 2 puffs twice a day for 1 to 2 weeks or until cough and wheeze free, then return to original dosing  Recurrent infection Monitor infections, antibiotic use, and steroid use.  Reaction to vaccine injection Consider testing and challenge in the clinic for flu vaccine  Call the clinic if this treatment plan is not working well for you.  Follow up in 6 months or sooner if needed.

## 2024-02-28 NOTE — Progress Notes (Signed)
 [address] [place] [address] Dept: [phone]  FOLLOW UP NOTE  Patient ID: Stephanie Duncan, female    DOB: 26-Nov-1960  Age: 63 y.o. MRN: [id number] Date of Office Visit: 02/28/2024  Assessment  Chief Complaint: Follow-up (Allergies /Asthma/No concerns)  HPI Stephanie Duncan is a 63 year old female who presents to the clinic for follow-up visit.  She was last seen in this clinic on 08/06/2023 by Dr. Luke for evaluation of asthma on Fasenra , chronic rhinitis, recurrent infection, and reaction to influenza vaccine.  Her last environmental allergy testing via lab on 03/04/2020 was negative to the adult environmental panel.  Discussed the use of AI scribe software for clinical note transcription with the patient, who gave verbal consent to proceed.  History of Present Illness Stephanie Duncan is a 63 year old female with asthma who presents for follow-up regarding her asthma management.  She has no shortness of breath, wheezing, or coughing, and experiences no difficulty with physical activity. She continues to take montelukast  daily and uses Symbicort  160, one puff twice a day. She also receives Fasenra  injections with no large or local reactions.  She reports a significant decrease in her symptoms of asthma while continuing on Fasenra  injections.  She has not needed to increase her Symbicort  use for asthma flares since the last visit.  She was recently hospitalized for pneumonia and influenza, which occurred simultaneously. This was her second episode of pneumonia in the last 15 to 20 years. The symptoms began in December of the previous year, progressively worsening, and she was outside in the cold during the onset. She reports no fever or productive cough since her hospitalization for pneumonia. She experienced symptoms for an extended period before hospitalization. No recent infections requiring antibiotics since the hospitalization.  Her current medications are listed in the chart.   Drug Allergies:   Allergies  Allergen Reactions   Influenza Vaccines Hives, Itching, Rash and Swelling   Cefdinir Other (See Comments)    Stomach aches, emesis and blood in stool    Physical Exam: BP 108/72   Pulse 73   Temp 97.7 F (36.5 C)   Ht 5' 5.25 (1.657 m)   Wt 167 lb 9.6 oz (76 kg)   SpO2 97%   BMI 27.68 kg/m    Physical Exam Vitals reviewed.  Constitutional:      Appearance: Normal appearance.  HENT:     Head: Normocephalic and atraumatic.     Right Ear: Tympanic membrane normal.     Left Ear: Tympanic membrane normal.     Nose:     Comments: Bilateral nares slightly erythematous with thin clear nasal drainage noted.  Pharynx normal.  Ears normal.  Eyes normal.    Mouth/Throat:     Pharynx: Oropharynx is clear.  Eyes:     Conjunctiva/sclera: Conjunctivae normal.  Cardiovascular:     Rate and Rhythm: Normal rate and regular rhythm.     Heart sounds: Normal heart sounds. No murmur heard. Pulmonary:     Effort: Pulmonary effort is normal.     Breath sounds: Normal breath sounds.     Comments: Lungs clear to auscultation Musculoskeletal:        General: Normal range of motion.     Cervical back: Normal range of motion and neck supple.  Skin:    General: Skin is warm and dry.  Neurological:     Mental Status: She is alert and oriented to person, place, and time.  Psychiatric:  Mood and Affect: Mood normal.        Behavior: Behavior normal.        Thought Content: Thought content normal.        Judgment: Judgment normal.    Diagnostics: Spirometry at follow-up visit  Assessment and Plan: 1. Severe persistent asthma without complication (HCC)   2. Adverse reaction to viral vaccines, subsequent encounter   3. Recurrent infections     Patient Instructions  Asthma Continue Symbicort  1 puff once a day with a spacer to prevent cough or wheeze Continue montelukast  10 mg once a day Continue Fasenra  injections once every 8 weeks For asthma flare increase Symbicort   to 2 puffs twice a day for 1 to 2 weeks or until cough and wheeze free, then return to original dosing  Recurrent infection Monitor infections, antibiotic use, and steroid use.  Reaction to vaccine injection Consider testing and challenge in the clinic for flu vaccine  Call the clinic if this treatment plan is not working well for you.  Follow up in 6 months or sooner if needed.  Return in about 6 months (around 08/28/2024), or if symptoms worsen or fail to improve.    Thank you for the opportunity to care for this patient.  Please do not hesitate to contact me with questions.  Arlean Mutter, FNP [facility] 

## 2024-02-29 ENCOUNTER — Encounter: Payer: Self-pay | Admitting: Family Medicine

## 2024-02-29 DIAGNOSIS — T50B95D Adverse effect of other viral vaccines, subsequent encounter: Secondary | ICD-10-CM | POA: Insufficient documentation

## 2024-02-29 DIAGNOSIS — B999 Unspecified infectious disease: Secondary | ICD-10-CM | POA: Insufficient documentation

## 2024-02-29 MED ORDER — ALBUTEROL SULFATE (2.5 MG/3ML) 0.083% IN NEBU: 2.5000 mg | INHALATION_SOLUTION | RESPIRATORY_TRACT | 2 refills | Status: AC | PRN

## 2024-02-29 MED ORDER — EPINEPHRINE 0.3 MG/0.3ML IJ SOAJ: 0.3000 mg | INTRAMUSCULAR | 1 refills | Status: AC | PRN

## 2024-02-29 MED ORDER — BENRALIZUMAB 30 MG/ML ~~LOC~~ SOSY
30.0000 mg | PREFILLED_SYRINGE | Freq: Once | SUBCUTANEOUS | Status: AC
  Administered 2024-04-30: 30 mg via SUBCUTANEOUS

## 2024-04-22 ENCOUNTER — Ambulatory Visit

## 2024-04-30 ENCOUNTER — Ambulatory Visit (INDEPENDENT_AMBULATORY_CARE_PROVIDER_SITE_OTHER)

## 2024-04-30 DIAGNOSIS — J4550 Severe persistent asthma, uncomplicated: Secondary | ICD-10-CM

## 2024-05-20 ENCOUNTER — Other Ambulatory Visit (HOSPITAL_COMMUNITY): Payer: Self-pay

## 2024-05-20 ENCOUNTER — Other Ambulatory Visit: Payer: Self-pay | Admitting: Allergy

## 2024-06-26 ENCOUNTER — Ambulatory Visit
